# Patient Record
Sex: Female | Born: 1970
Health system: Southern US, Community
[De-identification: ages and names within clinical notes are randomized; demographics above are authoritative.]

## PROBLEM LIST (undated history)

## (undated) DIAGNOSIS — E119 Type 2 diabetes mellitus without complications: Secondary | ICD-10-CM

## (undated) HISTORY — PX: TONSILLECTOMY: SUR1361

## (undated) HISTORY — PX: ORTHOPEDIC SURGERY: SHX850

---

## 2008-12-03 ENCOUNTER — Ambulatory Visit: Payer: Self-pay | Admitting: Diagnostic Radiology

## 2008-12-03 ENCOUNTER — Emergency Department (HOSPITAL_BASED_OUTPATIENT_CLINIC_OR_DEPARTMENT_OTHER): Admission: EM | Admit: 2008-12-03 | Discharge: 2008-12-03 | Payer: Self-pay | Admitting: Emergency Medicine

## 2011-05-08 ENCOUNTER — Ambulatory Visit (INDEPENDENT_AMBULATORY_CARE_PROVIDER_SITE_OTHER): Payer: BC Managed Care – PPO

## 2011-05-08 DIAGNOSIS — M545 Low back pain, unspecified: Secondary | ICD-10-CM

## 2011-05-08 DIAGNOSIS — M25569 Pain in unspecified knee: Secondary | ICD-10-CM

## 2011-05-08 DIAGNOSIS — S73119A Iliofemoral ligament sprain of unspecified hip, initial encounter: Secondary | ICD-10-CM

## 2011-05-08 DIAGNOSIS — M25559 Pain in unspecified hip: Secondary | ICD-10-CM

## 2011-05-08 DIAGNOSIS — S63509A Unspecified sprain of unspecified wrist, initial encounter: Secondary | ICD-10-CM

## 2011-05-08 DIAGNOSIS — IMO0002 Reserved for concepts with insufficient information to code with codable children: Secondary | ICD-10-CM

## 2012-02-22 ENCOUNTER — Emergency Department (INDEPENDENT_AMBULATORY_CARE_PROVIDER_SITE_OTHER)
Admission: EM | Admit: 2012-02-22 | Discharge: 2012-02-22 | Disposition: A | Discharge status: Home / Self Care | Payer: BC Managed Care – PPO | Source: Home / Self Care

## 2012-02-22 ENCOUNTER — Encounter (HOSPITAL_COMMUNITY): Payer: Self-pay | Admitting: *Deleted

## 2012-02-22 DIAGNOSIS — M7711 Lateral epicondylitis, right elbow: Secondary | ICD-10-CM

## 2012-02-22 HISTORY — DX: Type 2 diabetes mellitus without complications: E11.9

## 2012-02-22 MED ORDER — HYDROCODONE-ACETAMINOPHEN 10-500 MG PO TABS: 1.0000 | ORAL_TABLET | Freq: Four times a day (QID) | ORAL | Status: DC | PRN

## 2012-02-22 NOTE — ED Notes (Signed)
Pt  Reports  r  Elbow  Pain  With  The  Pain  Radiating  Upwards  Toward  Her  Shoulder  She  denys  Any  specefic  Injury       She  Reports  The pain for  A  Month  And  Getting  Worse  Over last week

## 2012-02-22 NOTE — ED Provider Notes (Signed)
History     CSN: [id number]  Arrival date & time 02/22/12  1228   First MD Initiated Contact with Patient 02/22/12 1235      Chief Complaint  Patient presents with  . Elbow Pain   HPI 41 yr old hispanic female presentes to [facility] for elbow pain.  She states she has pain from her on the outer side of the elbow and it was painful to touch.  openeing a door and lifting things hurt is.  THis has been going on for the past 3 weeks to 1 month ago.  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 States that she was moving some furniture couple of weeks ago which was started this pain. States that pain associated but she cannot really move it and at night when she sleeps she has pain that debilitated and nicks it difficult for her to sleep. She has history of diabetes and is on insulin for this she has no primary care physician.  Past Medical History  Diagnosis Date  . Diabetes mellitus without complication     History reviewed. No pertinent past surgical history.  No family history on file.  History  Substance Use Topics  . Smoking status: Never Smoker   . Smokeless tobacco: Not on file  . Alcohol Use: No    OB History    Grav Para Term Preterm Abortions TAB SAB Ect Mult Living                  Review of Systems No headache no chest pain shortness of breath nausea vomiting no other issue  Allergies  Penicillins  Home Medications   Current Outpatient Rx  Name Route Sig Dispense Refill  . LANTUS [place] Subcutaneous Inject into the skin.    Marland Kitchen HUMALOG [place] Subcutaneous Inject into the skin.    Marland Kitchen METFORMIN HCL PO Oral Take by mouth.    Marland Kitchen RAMIPRIL PO Oral Take by mouth.      BP 142/86  Pulse 79  Temp 98.2 F (36.8 C) (Oral)  Resp 16  SpO2 100%  Physical Exam Morbid obesity, There is no height or weight on file to calculate BMI. Her into very pleasant no  distress Chest: Clear not sound S1-S2 no murmur rub or gallop Point tenderness over the right epicondyle laterally of the arm. Extensive flexion of a closed fist against resistance reproduces her pain  ED Course  ORTHOPEDIC INJURY TREATMENT Date/Time: 02/22/2012 1:46 PM Performed by: Rhetta Mura Authorized by: Rhetta Mura Consent: Verbal consent obtained. Consent given by: patient Patient understanding: patient states understanding of the procedure being performed Patient consent: the patient's understanding of the procedure matches consent given Procedure consent: procedure consent matches procedure scheduled Relevant documents: relevant documents present and verified Test results: test results available and properly labeled Site marked: the operative site was marked Imaging studies: imaging studies available Patient identity confirmed: verbally with patient Injury location: forearm Pre-procedure distal perfusion: normal Pre-procedure neurological function: normal Pre-procedure range of motion: normal Local anesthesia used: yes Anesthesia: local infiltration Local anesthetic: lidocaine 2% with epinephrine Patient sedated: no Post-procedure distal perfusion: normal Post-procedure neurological function: normal Post-procedure range of motion: normal Comments: Patient was injected with Depo-Medrol 40 and lidocaine 2% in the lateral epicondyle of the right arm by Dr. Leslee Home under informed consent. The patient experienced some pain subsequent  to this injection however felt better on discharge.   (including critical care time)  Labs Reviewed - No data to display No results found.   No diagnosis found.    MDM  Patient was injected with lidocaine and steroids as per above-she was instructed in post care instructions inclusive of rest 3 days, icing the area. Further management was recommended in terms of her using exercises for lateral epicondylitis and  these were given to her. Patient was instructed to get a primary care physician and I left her with a couple of names in the room.   [place], [facility] 1:47 PM         Rhetta Mura, MD 02/22/12 1348

## 2012-05-10 ENCOUNTER — Ambulatory Visit: Payer: BC Managed Care – PPO

## 2012-05-10 ENCOUNTER — Ambulatory Visit (INDEPENDENT_AMBULATORY_CARE_PROVIDER_SITE_OTHER): Payer: BC Managed Care – PPO | Admitting: Family Medicine

## 2012-05-10 DIAGNOSIS — S161XXA Strain of muscle, fascia and tendon at neck level, initial encounter: Secondary | ICD-10-CM

## 2012-05-10 DIAGNOSIS — S39012A Strain of muscle, fascia and tendon of lower back, initial encounter: Secondary | ICD-10-CM

## 2012-05-10 DIAGNOSIS — IMO0002 Reserved for concepts with insufficient information to code with codable children: Secondary | ICD-10-CM

## 2012-05-10 DIAGNOSIS — M542 Cervicalgia: Secondary | ICD-10-CM

## 2012-05-10 MED ORDER — IBUPROFEN 800 MG PO TABS: 800.0000 mg | ORAL_TABLET | Freq: Three times a day (TID) | ORAL | Status: AC | PRN

## 2012-05-10 MED ORDER — CYCLOBENZAPRINE HCL 10 MG PO TABS: 10.0000 mg | ORAL_TABLET | Freq: Two times a day (BID) | ORAL | Status: AC | PRN

## 2012-05-10 NOTE — Patient Instructions (Addendum)
Let me know if your neck/ back are not better in the next few days- Sooner if worse.
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
## 2013-04-25 ENCOUNTER — Emergency Department (INDEPENDENT_AMBULATORY_CARE_PROVIDER_SITE_OTHER): Payer: BC Managed Care – PPO

## 2013-04-25 ENCOUNTER — Encounter (HOSPITAL_COMMUNITY): Payer: Self-pay | Admitting: Emergency Medicine

## 2013-04-25 ENCOUNTER — Emergency Department (HOSPITAL_COMMUNITY)
Admission: EM | Admit: 2013-04-25 | Discharge: 2013-04-25 | Disposition: A | Discharge status: Home / Self Care | Payer: BC Managed Care – PPO | Source: Home / Self Care | Attending: Emergency Medicine | Admitting: Emergency Medicine

## 2013-04-25 DIAGNOSIS — J019 Acute sinusitis, unspecified: Secondary | ICD-10-CM

## 2013-04-25 DIAGNOSIS — J209 Acute bronchitis, unspecified: Secondary | ICD-10-CM

## 2013-04-25 MED ORDER — LEVOFLOXACIN 750 MG PO TABS: 750.0000 mg | ORAL_TABLET | Freq: Every day | ORAL | Status: AC

## 2013-04-25 MED ORDER — HYDROCOD POLST-CHLORPHEN POLST 10-8 MG/5ML PO LQCR: 5.0000 mL | Freq: Two times a day (BID) | ORAL | Status: AC | PRN

## 2013-04-25 NOTE — ED Provider Notes (Signed)
Chief Complaint:   Chief Complaint  Patient presents with  . URI    History of Present Illness:   Tiffany Velazquez is a 42 year old female with diabetes who has had a 10-14 day history of sore throat, fever up to 101, chills, sweats, aches, fatigue, weakness, nonproductive cough, chest tightness, wheezing, chest pain, nasal congestion with clear drainage, headache, sinus pressure, ear congestion, nausea, and vomiting. She went to [facility] when this began. A chest x-ray was obtained but she does not know the results. She was given a Z-Pak and an inhaler but does not feel any better.  Review of Systems:  Other than noted above, the patient denies any of the following symptoms: Systemic:  No fevers, chills, sweats, weight loss or gain, fatigue, or tiredness. Eye:  No redness or discharge. ENT:  No ear pain, drainage, headache, nasal congestion, drainage, sinus pressure, difficulty swallowing, or sore throat. Neck:  No neck pain or swollen glands. Lungs:  No cough, sputum production, hemoptysis, wheezing, chest tightness, shortness of breath or chest pain. GI:  No abdominal pain, nausea, vomiting or diarrhea.  PMFSH:  Past medical history, family history, social history, meds, and allergies were reviewed. She is allergic to penicillin. She has diabetes and takes Lantus insulin, Humalog insulin, metformin, and Altace.  Physical Exam:   Vital signs:  LMP 04/23/2013 General:  Alert and oriented.  In no distress.  Skin warm and dry. Eye:  No conjunctival injection or drainage. Lids were normal. ENT:  TMs and canals were normal, without erythema or inflammation.  Nasal mucosa was clear and uncongested, without drainage.  Mucous membranes were moist.  Pharynx was clear with no exudate or drainage.  There were no oral ulcerations or lesions. Neck:  Supple, no adenopathy, tenderness or mass. Lungs:  No respiratory distress.  Lungs were clear to auscultation, without wheezes, rales or  rhonchi.  Breath sounds were clear and equal bilaterally.  Heart:  Regular rhythm, without gallops, murmers or rubs. Skin:  Clear, warm, and dry, without rash or lesions.  Radiology:  Dg Chest 2 View  04/25/2013   CLINICAL DATA:  Productive cough and chest congestion  EXAM: CHEST  2 VIEW  COMPARISON:  None.  FINDINGS: Lungs are clear. Heart size and pulmonary vascularity are normal. No adenopathy. No pneumothorax. No bone lesions. There is slight upper thoracic levoscoliosis.  IMPRESSION: No abnormality noted.   Electronically Signed   By: Bretta Bang M.D.   On: 04/25/2013 09:45   Assessment:  The primary encounter diagnosis was Acute bronchitis. A diagnosis of Acute sinusitis was also pertinent to this visit.  Plan:   1.  Meds:  The following meds were prescribed:   Discharge Medication List as of 04/25/2013  9:55 AM    START taking these medications   Details  chlorpheniramine-HYDROcodone (TUSSIONEX) 10-8 MG/5ML LQCR Take 5 mLs by mouth every 12 (twelve) hours as needed for cough., Starting 04/25/2013, Until Discontinued, Normal    levofloxacin (LEVAQUIN) 750 MG tablet Take 1 tablet (750 mg total) by mouth daily., Starting 04/25/2013, Until Discontinued, Normal        2.  Patient Education/Counseling:  The patient was given appropriate handouts, self care instructions, and instructed in symptomatic relief. Should continue to use her albuterol inhaler.  3.  Follow up:  The patient was told to follow up if no better in 3 to 4 days, if becoming worse in any way, and given some red flag symptoms such as increasing fever or worsening  difficulty breathing which would prompt immediate return.  Follow up here if necessary.      Reuben Likes, MD 04/25/13 1051

## 2013-04-25 NOTE — ED Notes (Signed)
C/o  Productive cough with sob.  Chest soreness.  Body aches.  Sore throat.  And chills.  Pt states that dizziness started today.   Pt has used z pak and over counter meds with no relief.  Pt states feels like I'm getting worse.

## 2016-02-28 ENCOUNTER — Ambulatory Visit (INDEPENDENT_AMBULATORY_CARE_PROVIDER_SITE_OTHER): Payer: BC Managed Care – PPO

## 2016-02-28 ENCOUNTER — Ambulatory Visit (INDEPENDENT_AMBULATORY_CARE_PROVIDER_SITE_OTHER): Payer: BC Managed Care – PPO | Admitting: Podiatry

## 2016-02-28 ENCOUNTER — Encounter: Payer: Self-pay | Admitting: Podiatry

## 2016-02-28 VITALS — Ht 63.0 in | Wt 200.0 lb

## 2016-02-28 DIAGNOSIS — M79671 Pain in right foot: Secondary | ICD-10-CM

## 2016-02-28 DIAGNOSIS — M722 Plantar fascial fibromatosis: Secondary | ICD-10-CM

## 2016-02-28 MED ORDER — NONFORMULARY OR COMPOUNDED ITEM: 1.0000 g | Freq: Four times a day (QID) | 2 refills | Status: AC

## 2016-02-28 NOTE — Progress Notes (Signed)
   Subjective:    Patient ID: Tiffany DeisHeidy Velazquez, female    DOB: 02/10/1971, 45 y.o.   MRN: [id number]  HPI    Review of Systems  Gastrointestinal: Positive for diarrhea.  Musculoskeletal: Positive for back pain.       Objective:   Physical Exam        Assessment & Plan:

## 2016-03-01 ENCOUNTER — Ambulatory Visit: Payer: Self-pay | Admitting: Podiatry

## 2016-03-05 MED ORDER — BETAMETHASONE SOD PHOS & ACET 6 (3-3) MG/ML IJ SUSP: 3.0000 mg | Freq: Once | INTRAMUSCULAR | Status: AC

## 2016-03-05 NOTE — Progress Notes (Signed)
Patient ID: Tiffany DeisHeidy Velazquez, female   DOB: Nov 25, 1970, 45 y.o.   MRN: [id number] Subjective: Patient presents today for pain and tenderness in the right foot. Patient states the foot pain has been hurting for several weeks now. Patient states that it hurts in the mornings with the first steps out of bed. Patient presents today for further treatment and evaluation  Objective: Physical Exam General: The patient is alert and oriented x3 in no acute distress.  Dermatology: Skin is warm, dry and supple bilateral lower extremities. Negative for open lesions or macerations bilateral.   Vascular: Dorsalis Pedis and Posterior Tibial pulses palpable bilateral.  Capillary fill time is immediate to all digits.  Neurological: Epicritic and protective threshold intact bilateral.   Musculoskeletal: Tenderness to palpation at the medial calcaneal tubercale and through the insertion of the plantar fascia of the right foot. All other joints range of motion within normal limits bilateral. Strength 5/5 in all groups bilateral.   Radiographic exam: Normal osseous mineralization. Joint spaces preserved. No fracture/dislocation/boney destruction. Calcaneal spur present with mild thickening of plantar fascia right. No other soft tissue abnormalities or radiopaque foreign bodies.   Assessment: 1. Plantar fasciitis right 2. Pain in right foot  Plan of Care:  1. Patient evaluated. Xrays reviewed.   2. Injection of 0.5cc Cel[place] soluspan injected into the right heel at the insertion of the plantar fascia.  3. Instructed patient regarding therapies and modalities at home to alleviate symptoms.  4.Rx for anti-inflammatory pain cream dispensed through [facility]. 5. Plantar fascial band(s) dispensed. 6. Return to clinic in 4 weeks.

## 2016-03-29 ENCOUNTER — Ambulatory Visit (INDEPENDENT_AMBULATORY_CARE_PROVIDER_SITE_OTHER): Payer: BC Managed Care – PPO | Admitting: Podiatry

## 2016-03-29 DIAGNOSIS — M79671 Pain in right foot: Secondary | ICD-10-CM

## 2016-03-29 DIAGNOSIS — M722 Plantar fascial fibromatosis: Secondary | ICD-10-CM

## 2016-03-29 DIAGNOSIS — M7731 Calcaneal spur, right foot: Secondary | ICD-10-CM | POA: Diagnosis not present

## 2016-03-29 NOTE — Patient Instructions (Signed)
Pre-Operative Instructions  Congratulations, you have decided to take an important step to improving your quality of life.  You can be assured that the doctors of [facility] will be with you every step of the way.  1. Plan to be at the surgery center/hospital at least 1 (one) hour prior to your scheduled time unless otherwise directed by the surgical center/hospital staff.  You must have a responsible adult accompany you, remain during the surgery and drive you home.  Make sure you have directions to the surgical center/hospital and know how to get there on time. 2. For hospital based surgery you will need to obtain a history and physical form from your family physician within 1 month prior to the date of surgery- we will give you a form for you primary physician.  3. We make every effort to accommodate the date you request for surgery.  There are however, times where surgery dates or times have to be moved.  We will contact you as soon as possible if a change in schedule is required.   4. No Aspirin/Ibuprofen for one week before surgery.  If you are on aspirin, any non-steroidal anti-inflammatory medications (Mobic, Aleve, Ibuprofen) you should stop taking it 7 days prior to your surgery.  You make take Tylenol  For pain prior to surgery.  5. Medications- If you are taking daily heart and blood pressure medications, seizure, reflux, allergy, asthma, anxiety, pain or diabetes medications, make sure the surgery center/hospital is aware before the day of surgery so they may notify you which medications to take or avoid the day of surgery. 6. No food or drink after midnight the night before surgery unless directed otherwise by surgical center/hospital staff. 7. No alcoholic beverages 24 hours prior to surgery.  No smoking 24 hours prior to or 24 hours after surgery. 8. Wear loose pants or shorts- loose enough to fit over bandages, boots, and casts. 9. No slip on shoes, sneakers are best. 10. Bring  your boot with you to the surgery center/hospital.  Also bring crutches or a walker if your physician has prescribed it for you.  If you do not have this equipment, it will be provided for you after surgery. 11. If you have not been contracted by the surgery center/hospital by the day before your surgery, call to confirm the date and time of your surgery. 12. Leave-time from work may vary depending on the type of surgery you have.  Appropriate arrangements should be made prior to surgery with your employer. 13. Prescriptions will be provided immediately following surgery by your doctor.  Have these filled as soon as possible after surgery and take the medication as directed. 14. Remove nail polish on the operative foot. 15. Wash the night before surgery.  The night before surgery wash the foot and leg well with the antibacterial soap provided and water paying special attention to beneath the toenails and in between the toes.  Rinse thoroughly with water and dry well with a towel.  Perform this wash unless told not to do so by your physician.  Enclosed: 1 Ice pack (please put in freezer the night before surgery)   1 Hibiclens skin cleaner   Pre-op Instructions  If you have any questions regarding the instructions, do not hesitate to call our office.  [place]: [address] [place], [place] [address] [phone]  [place]: [address] [place], [place] [address] [phone]  [place]: [address]  [place], [place] [address] [phone]   Dr.   Norman Regal DPM, Dr. Matthew Wagoner DPM, Dr. M. Todd Hyatt DPM, Dr. Titorya Stover DPM 

## 2016-03-30 ENCOUNTER — Telehealth: Payer: Self-pay | Admitting: *Deleted

## 2016-03-30 NOTE — Telephone Encounter (Signed)
"  I was there yesterday.  I decided on a date for my surgery.  I would like to do it on January 4."  I will get it scheduled.  Someone from the surgical center will call you a day or two prior to with the arrival time.  You can go ahead and register with the surgical center.  Instructions are in your brochure that was given to you.  "I don't have a computer at home and I don't have the brochure with me.  Can you give me the instructions to register?"  I gave patient the requested information.

## 2016-04-02 NOTE — Progress Notes (Signed)
Patient ID: Tiffany DeisHeidy Heiss, female   DOB: [birth year], 45 y.o.   MRN: [id number] Subjective: Patient presents today for follow-up evaluation of plantar fasciitis to the right foot. Patient states that she continues to have ongoing pain despite all conservative treatment. Patient states that her pain is on the bottom of the heel. Patient states that the plantar fascial bands are not helping. The anti-inflammatory pain cream dispensed through [facility] is not helping. She also states that the injections did not help.  Patient presents today for further treatment and evaluation  Objective: Physical Exam General: The patient is alert and oriented x3 in no acute distress.  Dermatology: Skin is warm, dry and supple bilateral lower extremities. Negative for open lesions or macerations bilateral.   Vascular: Dorsalis Pedis and Posterior Tibial pulses palpable bilateral.  Capillary fill time is immediate to all digits.  Neurological: Epicritic and protective threshold intact bilateral.   Musculoskeletal: Tenderness to palpation at the medial calcaneal tubercale and through the insertion of the plantar fascia of the right foot. All other joints range of motion within normal limits bilateral. Strength 5/5 in all groups bilateral.   Radiographic exam: There is a large prominent irregular plantar calcaneal heel spur noted to the right lower extremity. Abnormal spur formation.  Assessment: 1. Plantar fasciitis right 2. Pain in right foot 3. Retrocalcaneal heel spur right lower extremity  Plan of Care:  1. Patient evaluated. Xrays reviewed.   2. Today we discussed in detail the conservative versus surgical management of plantar fasciitis. All conservative modalities have failed to provide any sore satisfactory alleviation of symptoms the patient. The patient opted for surgical correction. All risks and possible complications and details of the procedure were explained. 3. Surgery would consist of  plantar fasciotomy possibly open with resection of plantar calcaneal heel spur. 4. Authorization for surgery was initiated today. 5. Patient was scanned for custom molded orthotics 6. Postoperative shoe dispensed 7. Return to clinic in 1 week postop

## 2016-04-03 ENCOUNTER — Telehealth: Payer: Self-pay | Admitting: *Deleted

## 2016-04-03 NOTE — Telephone Encounter (Signed)
"  I have a form that I faxed to you guys.  Let me know if you got them.  If you want me to scan them to you let me know."

## 2016-05-02 DIAGNOSIS — M7731 Calcaneal spur, right foot: Secondary | ICD-10-CM | POA: Diagnosis not present

## 2016-05-02 DIAGNOSIS — M722 Plantar fascial fibromatosis: Secondary | ICD-10-CM | POA: Diagnosis not present

## 2016-05-04 ENCOUNTER — Encounter: Payer: Self-pay | Admitting: Podiatry

## 2016-05-04 NOTE — Progress Notes (Signed)
Patient presents this AM for surgical excision of a plantar heel spur to the right lower extremity with plantar fasciectomy.   Patient also complains this morning of pain relating to the fifth digit of the right foot.  Patient states the pain has been ongoing for several months now.  Patient has an overlying callus lesion also noted to the fifth digit right foot.  This morning that the decision was made preoperatively to go ahead with an exostectomy to the fifth digit right foot.  All possible complications and ils of the surgery were explained in detail.  All patient questions were answered.  No guarantees were expressed or implied.  Procedure was added to the consent and both surgeon andpatient initials were placed.  Assessment: Symptomatic exostosis/spur fifth digit right foot  Plan: Exostectomy fifth digit right foot   Felecia ShellingBrent M. Ninamarie Keel, DPM [facility]  Dr. Felecia ShellingBrent M. Tyray Proch, DPM    [address]                                        [place], [place] [address]                Office [phone]  Fax [phone]

## 2016-05-08 ENCOUNTER — Telehealth: Payer: Self-pay

## 2016-05-08 NOTE — Telephone Encounter (Signed)
LVM regarding pt post op status, she is to call with questions or concerns

## 2016-05-10 ENCOUNTER — Ambulatory Visit (INDEPENDENT_AMBULATORY_CARE_PROVIDER_SITE_OTHER): Payer: BC Managed Care – PPO

## 2016-05-10 ENCOUNTER — Ambulatory Visit (INDEPENDENT_AMBULATORY_CARE_PROVIDER_SITE_OTHER): Payer: Self-pay | Admitting: Podiatry

## 2016-05-10 DIAGNOSIS — M7731 Calcaneal spur, right foot: Secondary | ICD-10-CM | POA: Diagnosis not present

## 2016-05-10 DIAGNOSIS — Z9889 Other specified postprocedural states: Secondary | ICD-10-CM

## 2016-05-10 MED ORDER — TRAMADOL HCL 50 MG PO TABS: 50.0000 mg | ORAL_TABLET | Freq: Three times a day (TID) | ORAL | 0 refills | Status: AC | PRN

## 2016-05-11 DIAGNOSIS — R52 Pain, unspecified: Secondary | ICD-10-CM

## 2016-05-14 NOTE — Progress Notes (Signed)
Subjective: Patient presents today status post one week open plantar fasciotomy with heel spur resection to the right foot. Date of surgery 10/02/2016. Patient states she has a moderate amount of pain. However she states she's doing well.  Objective: Skin incision is well coapted. Sutures are intact. No sign of infectious process.  Assessment: Status post open plantar fasciotomy with heel spur resection right foot. Date of surgery Dorene SorrowJerry fourth 2018.  Plan of care: Today patient was evaluated. Dressings were changed. Return to clinic in 1 week

## 2016-05-17 ENCOUNTER — Ambulatory Visit: Payer: BC Managed Care – PPO

## 2016-05-22 ENCOUNTER — Ambulatory Visit (INDEPENDENT_AMBULATORY_CARE_PROVIDER_SITE_OTHER): Payer: Self-pay | Admitting: Podiatry

## 2016-05-22 DIAGNOSIS — Z9889 Other specified postprocedural states: Secondary | ICD-10-CM

## 2016-05-24 ENCOUNTER — Encounter: Payer: Self-pay | Admitting: Podiatry

## 2016-05-28 NOTE — Progress Notes (Signed)
Subjective: Patient presents today status post one week open plantar fasciotomy with heel spur resection to the right foot. Date of surgery 05/04/2016. Patient states she has a moderate amount of pain. However she states she's doing well.  Objective: Skin incision is well coapted. Sutures are intact. No sign of infectious process.  Assessment: Status post open plantar fasciotomy with heel spur resection right foot. Date of surgery 05/04/2016  Plan of care: Today patient was evaluated. Sutures were removed. Patient begins getting the foot wet in 2 days.  Return to clinic in 2 weeks for possible ingrown toenail left great toe lateral border

## 2016-05-30 ENCOUNTER — Encounter: Payer: Self-pay | Admitting: Podiatry

## 2016-06-05 ENCOUNTER — Ambulatory Visit (INDEPENDENT_AMBULATORY_CARE_PROVIDER_SITE_OTHER): Payer: BC Managed Care – PPO | Admitting: Podiatry

## 2016-06-05 ENCOUNTER — Other Ambulatory Visit: Payer: Self-pay | Admitting: *Deleted

## 2016-06-05 ENCOUNTER — Encounter: Payer: Self-pay | Admitting: Urology

## 2016-06-05 DIAGNOSIS — L03039 Cellulitis of unspecified toe: Secondary | ICD-10-CM

## 2016-06-05 DIAGNOSIS — M79676 Pain in unspecified toe(s): Secondary | ICD-10-CM

## 2016-06-05 DIAGNOSIS — L600 Ingrowing nail: Secondary | ICD-10-CM

## 2016-06-05 DIAGNOSIS — Z9889 Other specified postprocedural states: Secondary | ICD-10-CM

## 2016-06-05 MED ORDER — GENTAMICIN SULFATE 0.1 % EX CREA: 1.0000 "application " | TOPICAL_CREAM | Freq: Three times a day (TID) | CUTANEOUS | 0 refills | Status: AC

## 2016-06-05 MED ORDER — DOXYCYCLINE HYCLATE 100 MG PO TABS: 100.0000 mg | ORAL_TABLET | Freq: Two times a day (BID) | ORAL | 0 refills | Status: DC

## 2016-06-05 MED ORDER — GENTAMICIN SULFATE 0.1 % EX CREA: 1.0000 "application " | TOPICAL_CREAM | Freq: Three times a day (TID) | CUTANEOUS | 0 refills | Status: DC

## 2016-06-05 NOTE — Patient Instructions (Signed)

## 2016-06-06 NOTE — Progress Notes (Signed)
DOS 01.04.2018 Endoscopic plantar fasciotomy (EPF) right. Possible open plantar fasciotomy with heel spur resection right.

## 2016-06-08 ENCOUNTER — Encounter: Payer: Self-pay | Admitting: Podiatry

## 2016-06-11 NOTE — Progress Notes (Signed)
   Subjective: Patient presents today for evaluation of pain in toe(s). Patient is concerned for possible ingrown nail. Patient states that the pain has been present for a few weeks now. Patient presents today for further treatment and evaluation. Patient also presents status post open plantar fasciotomy with heel spur resection to the right foot. There is surgery 05/04/2016. Patient still states that she has some moderate pain. However she is doing well.  Objective:  General: Well developed, nourished, in no acute distress, alert and oriented x3   Dermatology: Skin is warm, dry and supple bilateral. Lateral border of the left great toe appears to be erythematous with evidence of an ingrowing nail. Pain on palpation noted to the border of the nail fold. The remaining nails appear unremarkable at this time. There are no open sores, lesions.  Vascular: Dorsalis Pedis artery and Posterior Tibial artery pedal pulses palpable. No lower extremity edema noted.   Neruologic: Grossly intact via light touch bilateral.  Musculoskeletal: Muscular strength within normal limits in all groups bilateral. Normal range of motion noted to all pedal and ankle joints.   Assesement: #1 Paronychia with ingrowing nail lateral border left great toe #2 Pain in toe #3 Incurvated nail #4 status post open plantar fasciotomy with heel spur resection right foot. Date of surgery 05/04/2016.  Plan of Care:  1. Patient evaluated.  2. Discussed treatment alternatives and plan of care. Explained nail avulsion procedure and post procedure course to patient. 3. Patient opted for permanent partial nail avulsion.  4. Prior to procedure, local anesthesia infiltration utilized using 3 ml of a 50:50 mixture of 2% plain lidocaine and 0.5% plain marcaine in a normal hallux block fashion and a betadine prep performed.  5. Partial permanent nail avulsion with chemical matrixectomy performed using 3x30sec applications of phenol followed  by alcohol flush.  6. Light dressing applied. 7. Prescription for gentamicin cream 8. Prescription for doxycycline 100 mg 9. Return to clinic in 2 weeks.   Felecia ShellingBrent M. Bronsyn Shappell, DPM [facility]  Dr. Felecia ShellingBrent M. Jlyn Cerros, DPM    [address]                                        [place], [place] [address]                Office [phone]  Fax [phone]

## 2016-06-19 ENCOUNTER — Ambulatory Visit: Payer: BC Managed Care – PPO | Admitting: Podiatry

## 2016-06-21 ENCOUNTER — Telehealth: Payer: Self-pay | Admitting: *Deleted

## 2016-06-21 ENCOUNTER — Ambulatory Visit (INDEPENDENT_AMBULATORY_CARE_PROVIDER_SITE_OTHER): Payer: Self-pay | Admitting: Podiatry

## 2016-06-21 DIAGNOSIS — Z9889 Other specified postprocedural states: Secondary | ICD-10-CM

## 2016-06-21 MED ORDER — ACETAMINOPHEN-CODEINE #3 300-30 MG PO TABS: 1.0000 | ORAL_TABLET | Freq: Four times a day (QID) | ORAL | 0 refills | Status: AC | PRN

## 2016-06-21 MED ORDER — DOXYCYCLINE HYCLATE 100 MG PO TABS: 100.0000 mg | ORAL_TABLET | Freq: Two times a day (BID) | ORAL | 0 refills | Status: AC

## 2016-06-21 NOTE — Telephone Encounter (Addendum)
Pt states she is having more redness and swelling of the foot that had surgery 05/04/2016, and is back at work and having to take lots of breaks. Pt states she would like a strong pain medication that won't make her droozy, and can't take tramadol because it makes her nauseous and didn't;t help the pain. Left message informing pt that her being at work and walking was causing increased swelling and redness in the foot, to go back to resting and  Icing for comfort and swelling like when she 1st had her surgery. I told pt I would inform Dr. Logan BoresEvans and call with more information. 07/24/2016-DrLogan Bores. Evans ordered Peripheral Neuropathy cream from [facility]. Faxed orders to [facility].

## 2016-06-22 NOTE — Telephone Encounter (Signed)
Patient was seen at appointment on 06/21/16 at 4:15

## 2016-06-23 NOTE — Progress Notes (Signed)
   Subjective: Patient presents today for follow-up evaluation of the permanent partial nail avulsion to the lateral border of the left great toe. Patient states that it is completely resolved she's feeling much better. Patient is more concerned regarding her open plantar fasciotomy with heel spur resection to the right foot. Patient recently began going back to work and did an extensive amount of walking. She states that is very painful and swollen and sore. Patient also presents status post open plantar fasciotomy with heel spur resection to the right foot. There is surgery 05/04/2016. Patient still states that she has some moderate pain. However she is doing well.  Objective:  General: Well developed, nourished, in no acute distress, alert and oriented x3   Dermatology: Skin is warm, dry and supple bilateral. Lateral border of the left great toe appears to be erythematous with evidence of an ingrowing nail. Pain on palpation noted to the border of the nail fold. The remaining nails appear unremarkable at this time. There are no open sores, lesions.  Vascular: Dorsalis Pedis artery and Posterior Tibial artery pedal pulses palpable. No lower extremity edema noted.   Neruologic: Grossly intact via light touch bilateral.  Musculoskeletal: Muscular strength within normal limits in all groups bilateral. Normal range of motion noted to all pedal and ankle joints.   Assesement: #1 status post partial permanent nail avulsion lateral border left great toe-resolved  #2 status post open plantar fasciotomy with heel spur resection right foot. Date of surgery 05/04/2016. #3 pain and edema right foot  Plan of Care:  1. Patient evaluated.  2. Compression anklet was dispensed today 3. Prescription for Augmentin for possible cellulitis 4. Prescription for Tylenol with Codeine No. 3 5. Recommend new shoes at [employer] 6. Return to clinic in 4 weeks  Felecia ShellingBrent M. Ossiel Marchio, DPM [facility]  Dr. Felecia ShellingBrent M. Dewey Neukam, DPM    [address]                                        [place], [place] [address]                Office [phone]) [phone]  Fax [phone]

## 2016-06-26 ENCOUNTER — Ambulatory Visit: Payer: BC Managed Care – PPO | Admitting: Podiatry

## 2016-07-04 ENCOUNTER — Other Ambulatory Visit: Payer: Self-pay | Admitting: Podiatry

## 2016-07-24 ENCOUNTER — Ambulatory Visit (INDEPENDENT_AMBULATORY_CARE_PROVIDER_SITE_OTHER): Payer: BC Managed Care – PPO | Admitting: Podiatry

## 2016-07-24 DIAGNOSIS — M79671 Pain in right foot: Secondary | ICD-10-CM

## 2016-07-24 DIAGNOSIS — G5791 Unspecified mononeuropathy of right lower limb: Secondary | ICD-10-CM

## 2016-07-24 DIAGNOSIS — Z9889 Other specified postprocedural states: Secondary | ICD-10-CM

## 2016-07-24 MED ORDER — GABAPENTIN 100 MG PO CAPS: 100.0000 mg | ORAL_CAPSULE | Freq: Three times a day (TID) | ORAL | 0 refills | Status: AC

## 2016-07-24 MED ORDER — NONFORMULARY OR COMPOUNDED ITEM: 2 refills | Status: AC

## 2016-07-25 MED ORDER — NONFORMULARY OR COMPOUNDED ITEM: 2 refills | Status: AC

## 2016-07-28 NOTE — Progress Notes (Signed)
   Subjective: Patient also presents status post open plantar fasciotomy with heel spur resection to the right foot. There is surgery 05/04/2016. Patient continues to have some numbness on the medial side of the heel with intermittent sharp pains.  Objective:  General: Well developed, nourished, in no acute distress, alert and oriented x3   Dermatology: Skin is warm, dry and supple bilateral. Lateral border of the left great toe appears to be erythematous with evidence of an ingrowing nail. Pain on palpation noted to the border of the nail fold. The remaining nails appear unremarkable at this time. There are no open sores, lesions.  Vascular: Dorsalis Pedis artery and Posterior Tibial artery pedal pulses palpable. No lower extremity edema noted.   Neruologic: Grossly intact via light touch bilateral.  Musculoskeletal: Muscular strength within normal limits in all groups bilateral. Normal range of motion noted to all pedal and ankle joints.   Assesement: #1 status post partial permanent nail avulsion lateral border left great toe-resolved  #2 status post open plantar fasciotomy with heel spur resection right foot. Date of surgery 05/04/2016. #3 pain and edema right foot possibly secondary to neuritis  Plan of Care:  1. Patient evaluated.  2. Continue compression anklet as needed 3. Prescription for neuropathic pain cream dispensed through [facility] 4. Prescription for gabapentin 5. Return to clinic in 4 weeks  Felecia Shelling, DPM [facility]  Dr. Felecia Shelling, DPM    [address]                                        [place], [place] [address]                Office [phone]  Fax [phone]

## 2016-08-14 ENCOUNTER — Ambulatory Visit: Payer: BC Managed Care – PPO | Admitting: Podiatry

## 2017-07-21 ENCOUNTER — Encounter (HOSPITAL_COMMUNITY): Payer: Self-pay | Admitting: Emergency Medicine

## 2017-07-21 ENCOUNTER — Other Ambulatory Visit: Payer: Self-pay

## 2017-07-21 DIAGNOSIS — Z794 Long term (current) use of insulin: Secondary | ICD-10-CM | POA: Diagnosis not present

## 2017-07-21 DIAGNOSIS — R319 Hematuria, unspecified: Secondary | ICD-10-CM | POA: Insufficient documentation

## 2017-07-21 DIAGNOSIS — E119 Type 2 diabetes mellitus without complications: Secondary | ICD-10-CM | POA: Diagnosis not present

## 2017-07-21 DIAGNOSIS — R1031 Right lower quadrant pain: Secondary | ICD-10-CM | POA: Diagnosis present

## 2017-07-21 DIAGNOSIS — R109 Unspecified abdominal pain: Secondary | ICD-10-CM

## 2017-07-21 DIAGNOSIS — Z5321 Procedure and treatment not carried out due to patient leaving prior to being seen by health care provider: Secondary | ICD-10-CM | POA: Insufficient documentation

## 2017-07-21 DIAGNOSIS — R300 Dysuria: Secondary | ICD-10-CM | POA: Insufficient documentation

## 2017-07-21 DIAGNOSIS — N12 Tubulo-interstitial nephritis, not specified as acute or chronic: Secondary | ICD-10-CM | POA: Diagnosis not present

## 2017-07-21 DIAGNOSIS — R112 Nausea with vomiting, unspecified: Secondary | ICD-10-CM | POA: Insufficient documentation

## 2017-07-21 DIAGNOSIS — Z79899 Other long term (current) drug therapy: Secondary | ICD-10-CM | POA: Diagnosis not present

## 2017-07-21 NOTE — ED Triage Notes (Signed)
Pt c/o 10/10 bilateral flank pain radiating to her abd with nausea and vomiting, pt states she has a painful urination and blood on the urine. Since last Wednesday. No fever, but is having chills.

## 2017-07-22 ENCOUNTER — Emergency Department (HOSPITAL_COMMUNITY)
Admission: EM | Admit: 2017-07-22 | Discharge: 2017-07-22 | Disposition: A | Discharge status: Home / Self Care | Payer: BC Managed Care – PPO | Source: Home / Self Care

## 2017-07-22 ENCOUNTER — Other Ambulatory Visit: Payer: Self-pay

## 2017-07-22 ENCOUNTER — Encounter (HOSPITAL_BASED_OUTPATIENT_CLINIC_OR_DEPARTMENT_OTHER): Payer: Self-pay | Admitting: *Deleted

## 2017-07-22 ENCOUNTER — Emergency Department (HOSPITAL_BASED_OUTPATIENT_CLINIC_OR_DEPARTMENT_OTHER): Payer: BC Managed Care – PPO

## 2017-07-22 ENCOUNTER — Emergency Department (HOSPITAL_BASED_OUTPATIENT_CLINIC_OR_DEPARTMENT_OTHER)
Admission: EM | Admit: 2017-07-22 | Discharge: 2017-07-22 | Disposition: A | Discharge status: Home / Self Care | Payer: BC Managed Care – PPO | Attending: Physician Assistant | Admitting: Physician Assistant

## 2017-07-22 DIAGNOSIS — Z794 Long term (current) use of insulin: Secondary | ICD-10-CM | POA: Insufficient documentation

## 2017-07-22 DIAGNOSIS — Z79899 Other long term (current) drug therapy: Secondary | ICD-10-CM | POA: Insufficient documentation

## 2017-07-22 DIAGNOSIS — N12 Tubulo-interstitial nephritis, not specified as acute or chronic: Secondary | ICD-10-CM | POA: Insufficient documentation

## 2017-07-22 DIAGNOSIS — E119 Type 2 diabetes mellitus without complications: Secondary | ICD-10-CM | POA: Insufficient documentation

## 2017-07-22 LAB — BASIC METABOLIC PANEL
Anion gap: 13 (ref 5–15)
BUN: 14 mg/dL (ref 6–20)
CO2: 22 mmol/L (ref 22–32)
CREATININE: 0.8 mg/dL (ref 0.44–1.00)
Calcium: 9.5 mg/dL (ref 8.9–10.3)
Chloride: 99 mmol/L — ABNORMAL LOW (ref 101–111)
GFR calc Af Amer: >60 mL/min (ref >60)
GLUCOSE: 329 mg/dL — AB (ref 65–99)
POTASSIUM: 4 mmol/L (ref 3.5–5.1)
Sodium: 134 mmol/L — ABNORMAL LOW (ref 135–145)

## 2017-07-22 LAB — URINALYSIS, ROUTINE W REFLEX MICROSCOPIC
Bacteria, UA: NONE SEEN
Bilirubin Urine: NEGATIVE
Glucose, UA: >=500 mg/dL — AB
KETONES UR: NEGATIVE mg/dL
Leukocytes, UA: NEGATIVE
Nitrite: NEGATIVE
Specific Gravity, Urine: 1.03 (ref 1.005–1.030)
pH: 5 (ref 5.0–8.0)

## 2017-07-22 LAB — CBC WITH DIFFERENTIAL/PLATELET
Basophils Absolute: 0 10*3/uL (ref 0.0–0.1)
Basophils Relative: 0 %
EOS ABS: 0.1 10*3/uL (ref 0.0–0.7)
EOS PCT: 1 %
HCT: 38.8 % (ref 36.0–46.0)
Hemoglobin: 13.2 g/dL (ref 12.0–15.0)
LYMPHS ABS: 2 10*3/uL (ref 0.7–4.0)
LYMPHS PCT: 19 %
MCH: 29.7 pg (ref 26.0–34.0)
MCHC: 34 g/dL (ref 30.0–36.0)
MCV: 87.4 fL (ref 78.0–100.0)
MONO ABS: 0.9 10*3/uL (ref 0.1–1.0)
MONOS PCT: 8 %
Neutro Abs: 7.2 10*3/uL (ref 1.7–7.7)
Neutrophils Relative %: 72 %
Platelets: 329 10*3/uL (ref 150–400)
RBC: 4.44 MIL/uL (ref 3.87–5.11)
RDW: 12.7 % (ref 11.5–15.5)
WBC: 10.2 10*3/uL (ref 4.0–10.5)

## 2017-07-22 LAB — I-STAT BETA HCG BLOOD, ED (MC, WL, AP ONLY): I-stat hCG, quantitative: <5 m[IU]/mL (ref <5)

## 2017-07-22 LAB — CBG MONITORING, ED: GLUCOSE-CAPILLARY: 262 mg/dL — AB (ref 65–99)

## 2017-07-22 MED ORDER — CEPHALEXIN 250 MG PO CAPS
500.0000 mg | ORAL_CAPSULE | Freq: Once | ORAL | Status: AC
Start: 2017-07-22 — End: 2017-07-22
  Administered 2017-07-22: 500 mg via ORAL
  Filled 2017-07-22: qty 2

## 2017-07-22 MED ORDER — CEPHALEXIN 500 MG PO CAPS: 500.0000 mg | ORAL_CAPSULE | Freq: Four times a day (QID) | ORAL | 0 refills | Status: AC

## 2017-07-22 MED ORDER — KETOROLAC TROMETHAMINE 30 MG/ML IJ SOLN
15.0000 mg | Freq: Once | INTRAMUSCULAR | Status: AC
  Administered 2017-07-22: 15 mg via INTRAVENOUS
  Filled 2017-07-22: qty 1

## 2017-07-22 MED ORDER — PROMETHAZINE HCL 25 MG/ML IJ SOLN
12.5000 mg | Freq: Once | INTRAMUSCULAR | Status: AC
  Administered 2017-07-22: 12.5 mg via INTRAVENOUS
  Filled 2017-07-22: qty 1

## 2017-07-22 MED ORDER — ONDANSETRON 4 MG PO TBDP
4.0000 mg | ORAL_TABLET | Freq: Once | ORAL | Status: AC | PRN
  Administered 2017-07-22: 4 mg via ORAL
  Filled 2017-07-22: qty 1

## 2017-07-22 MED ORDER — ONDANSETRON HCL 4 MG PO TABS: 4.0000 mg | ORAL_TABLET | Freq: Three times a day (TID) | ORAL | 0 refills | Status: AC | PRN

## 2017-07-22 MED ORDER — SODIUM CHLORIDE 0.9 % IV BOLUS (SEPSIS)
1000.0000 mL | Freq: Once | INTRAVENOUS | Status: AC
  Administered 2017-07-22: 1000 mL via INTRAVENOUS

## 2017-07-22 NOTE — ED Notes (Signed)
ED Provider at bedside. 

## 2017-07-22 NOTE — ED Notes (Signed)
Patient transported to CT 

## 2017-07-22 NOTE — ED Notes (Signed)
Pt given d/c instructions as per chart. Rx x 2. Verbalizes understanding. No questions. 

## 2017-07-22 NOTE — ED Notes (Signed)
Pt was being [facility], but left d/t wait. C/O right lower abd and flank pain. Hx chronic issues with N/V and abdominal discomfort. Also reports seeing blood in urine. Pt is also a diabetic and feels her sugar is up.

## 2017-07-22 NOTE — Discharge Instructions (Signed)
You were seen today and found to have pyelonephritis.  This is an infection of the bladder that is moving up in the kidneys.  Please take the antibiotic provided.  We will also give you nausea medicine.  If you are unable to stay hydrated and unable to keep your antibiotics down or not improving please return immediately to the emergency department.

## 2017-07-22 NOTE — ED Triage Notes (Addendum)
Pt states she was seen [facility] for right flank pain and due to the wait she left and came to our facility.states pain started one week ago.  C/o nausea and vomiting for one year. C/o burning and pain with urination. States she has had hematuria. Pt is a diabetic. Fevers unknown. C/o chills on and off. Denies history of kidney stone

## 2017-07-22 NOTE — ED Provider Notes (Signed)
[facility] EMERGENCY DEPARTMENT Provider Note   CSN: [id number] Arrival date & time: 07/22/17  0424     History   Chief Complaint Chief Complaint  Patient presents with  . Flank Pain    HPI Tiffany Velazquez is a 47 y.o. female.  HPI   Patient is a 47 year old female presenting with blood in her urine.  She reports she is had symptoms of dysuria, urinary frequency and urgency.  No fevers.  She reports she is had back pain as well.  Right back pain worse than left.  No history of stones.  Patient has chronic nausea vomiting has been seen "by multiple doctors and they cannot figure out what is going on".  Patient does report that her nausea and vomiting is gotten worse  Past Medical History:  Diagnosis Date  . Diabetes mellitus without complication (HCC)     There are no active problems to display for this patient.   Past Surgical History:  Procedure Laterality Date  . CESAREAN SECTION    . ORTHOPEDIC SURGERY    . TONSILLECTOMY       OB History   None      Home Medications    Prior to Admission medications   Medication Sig Start Date End Date Taking? Authorizing Provider  acetaminophen-codeine (TYLENOL #3) 300-30 MG tablet Take 1-2 tablets by mouth every 6 (six) hours as needed for moderate pain. 06/21/16   Felecia Shelling, DPM  atorvastatin (LIPITOR) 40 MG tablet Take 40 mg by mouth. 09/20/15   [provider]  cephALEXin (KEFLEX) 500 MG capsule Take 1 capsule (500 mg total) by mouth 4 (four) times daily for 7 days. 07/22/17 07/29/17  Hatsue Sime Lyn, MD  chlorpheniramine-HYDROcodone (TUSSIONEX) 10-8 MG/5ML LQCR Take 5 mLs by mouth every 12 (twelve) hours as needed for cough. Patient not taking: Reported on 02/28/2016 04/25/13   Reuben Likes, MD  cyclobenzaprine (FLEXERIL) 10 MG tablet Take 1 tablet (10 mg total) by mouth 2 (two) times daily as needed for muscle spasms. Patient not taking: Reported on 02/28/2016 05/10/12   Copland, Gwenlyn Found, MD   doxycycline (VIBRA-TABS) 100 MG tablet Take 1 tablet (100 mg total) by mouth 2 (two) times daily. 06/21/16   Felecia Shelling, DPM  gabapentin (NEURONTIN) 100 MG capsule Take 1 capsule (100 mg total) by mouth 3 (three) times daily. 07/24/16   Felecia Shelling, DPM  gentamicin cream (GARAMYCIN) 0.1 % Apply 1 application topically 3 (three) times daily. 06/05/16   Felecia Shelling, DPM  ibuprofen (ADVIL,MOTRIN) 800 MG tablet Take 1 tablet (800 mg total) by mouth every 8 (eight) hours as needed for pain. Patient not taking: Reported on 02/28/2016 05/10/12   Copland, Gwenlyn Found, MD  Insulin Glargine (LANTUS [place]) Inject into the skin.    [provider]  Insulin Lispro, Human, (HUMALOG [place]) Inject into the skin.    [provider]  JANUMET 50-1000 MG tablet Take 1 tablet by mouth 2 (two) times daily. 02/05/16   [provider]  levofloxacin (LEVAQUIN) 750 MG tablet Take 1 tablet (750 mg total) by mouth daily. Patient not taking: Reported on 02/28/2016 04/25/13   Reuben Likes, MD  meloxicam (MOBIC) 15 MG tablet TAKE 1 TABLET BY MOUTH EVERY DAY 07/04/16   Felecia Shelling, DPM  METFORMIN HCL PO Take by mouth.    [provider]  NONFORMULARY OR COMPOUNDED ITEM Apply 1-2 g topically 4 (four) times daily. 02/28/16   Gala Lewandowsky  M, DPM  NONFORMULARY OR COMPOUNDED ITEM [facility]  Peripheral Neuropathy Cream- Bupivacaine 1%, Doxepin 3%, Gabapentin 6%, Pentoxifylline 3%, Topiramate 1% Apply 1-2 grams to affected area 3-4 times daily Qty. 120 gm 3 refills 07/24/16   Felecia ShellingEvans, Brent M, DPM  NONFORMULARY OR COMPOUNDED ITEM [facility]: Peripheral Neuropathy cream - Bupivacaine 15, Doxepin 3%, Gabapentin 6%, Pentoxifylline 3%, Topiramate 1%, apply 1-2 grams to affected area 3-4 times daily. 07/25/16   Felecia ShellingEvans, Brent M, DPM  ondansetron (ZOFRAN) 4 MG tablet Take 1 tablet (4 mg total) by mouth every 8 (eight) hours as needed for nausea or vomiting. 07/22/17   Latia Mataya Lyn, MD    oxyCODONE-acetaminophen (PERCOCET/ROXICET) 5-325 MG tablet Take 1 tablet by mouth every 4 (four) hours as needed for severe pain. 05/04/16   Felecia ShellingEvans, Brent M, DPM  RAMIPRIL PO Take by mouth.    [provider]  traMADol (ULTRAM) 50 MG tablet Take 1 tablet (50 mg total) by mouth every 8 (eight) hours as needed. 05/10/16   Felecia ShellingEvans, Brent M, DPM  VICTOZA 18 MG/3ML SOPN INJECT 0.2 MLS (1.2 MG TOTAL) INTO THE SKIN DAILY AS DIRECTED 02/15/16   [provider]    Family History No family history on file.  Social History Social History   Tobacco Use  . Smoking status: Never Smoker  . Smokeless tobacco: Never Used  Substance Use Topics  . Alcohol use: No  . Drug use: No     Allergies   Penicillins and Pioglitazone   Review of Systems Review of Systems  Constitutional: Negative for activity change and fatigue.  HENT: Negative for congestion and drooling.   Eyes: Negative for discharge.  Respiratory: Negative for cough and chest tightness.   Cardiovascular: Negative for chest pain.  Gastrointestinal: Positive for nausea and vomiting. Negative for abdominal distention.  Genitourinary: Positive for dysuria, flank pain, frequency and hematuria. Negative for difficulty urinating.  Musculoskeletal: Negative for joint swelling.  Skin: Negative for rash.  Allergic/Immunologic: Negative for immunocompromised state.  Neurological: Negative for seizures and speech difficulty.  Psychiatric/Behavioral: Negative for agitation and behavioral problems.  All other systems reviewed and are negative.    Physical Exam Updated Vital Signs BP (!) 160/84   Pulse 83   Temp 98.8 F (37.1 C) (Oral)   Resp 20   SpO2 98%   Physical Exam  Constitutional: She is oriented to person, place, and time. She appears well-developed and well-nourished.  HENT:  Head: Normocephalic and atraumatic.  Eyes: Right eye exhibits no discharge. Left eye exhibits no discharge.  Cardiovascular: Normal rate  and regular rhythm.  Pulmonary/Chest: Effort normal and breath sounds normal. No respiratory distress.  Abdominal: Soft. She exhibits no distension. There is no tenderness.  Musculoskeletal:  No real CVA pain  Neurological: She is oriented to person, place, and time.  Skin: Skin is warm and dry. She is not diaphoretic.  Psychiatric: She has a normal mood and affect.  Nursing note and vitals reviewed.    ED Treatments / Results  Labs (all labs ordered are listed, but only abnormal results are displayed) Labs Reviewed  CBG MONITORING, ED - Abnormal; Notable for the following components:      Result Value   Glucose-Capillary 262 (*)    All other components within normal limits  URINE CULTURE    EKG None  Radiology Ct Renal Stone Study  Result Date: 07/22/2017 CLINICAL DATA:  Flank pain EXAM: CT ABDOMEN AND PELVIS WITHOUT CONTRAST TECHNIQUE: Multidetector CT imaging of the  abdomen and pelvis was performed following the standard protocol without IV contrast. COMPARISON:  None. FINDINGS: Lower chest: No basilar pulmonary nodules or pleural effusion. No apical pericardial effusion. Hepatobiliary: Normal hepatic contours and density. No intra- or extrahepatic biliary dilatation. Normal gallbladder. Pancreas: Normal parenchymal contours without ductal dilatation. No peripancreatic fluid collection. Spleen: Normal. Adrenals/Urinary Tract: --Adrenal glands: Normal. --Right kidney/ureter: Mild hydroureteronephrosis. No nephroureterolithiasis. No source of obstruction is identified. Mild perinephric stranding. --Left kidney/ureter: Mild left hydroureteronephrosis and mild perinephric stranding. No nephroureterolithiasis. --Urinary bladder: Normal appearance for the degree of distention. Stomach/Bowel: --Stomach/Duodenum: No hiatal hernia or other gastric abnormality. Normal duodenal course. --Small bowel: No dilatation or inflammation. --Colon: No focal abnormality. --Appendix: Normal.  Vascular/Lymphatic: Normal course and caliber of the major abdominal vessels. No abdominal or pelvic lymphadenopathy. Reproductive: Normal uterus and ovaries. T-shaped intrauterine contraceptive device. Musculoskeletal. No bony spinal canal stenosis or focal osseous abnormality. Other: None. IMPRESSION: Bilateral mild hydroureteronephrosis and perinephric stranding without nephroureterolithiasis or source of obstruction identified. This may indicate ascending urinary tract infection. Electronically Signed   By: Deatra Robinson M.D.   On: 07/22/2017 05:56    Procedures Procedures (including critical care time)  Medications Ordered in ED Medications  sodium chloride 0.9 % bolus 1,000 mL (0 mLs Intravenous Stopped 07/22/17 0529)  ketorolac (TORADOL) 30 MG/ML injection 15 mg (15 mg Intravenous Given 07/22/17 0502)  promethazine (PHENERGAN) injection 12.5 mg (12.5 mg Intravenous Given 07/22/17 0522)  cephALEXin (KEFLEX) capsule 500 mg (500 mg Oral Given 07/22/17 0521)     Initial Impression / Assessment and Plan / ED Course  I have reviewed the triage vital signs and the nursing notes.  Pertinent labs & imaging results that were available during my care of the patient were reviewed by me and considered in my medical decision making (see chart for details).    Patient is a 47 year old female presenting with blood in her urine.  She reports she is had symptoms of dysuria, urinary frequency and urgency.  No fevers.  She reports she is had back pain as well.  Right back pain worse than left.  No history of stones.  Patient has chronic nausea vomiting has been seen "by multiple doctors and they cannot figure out what is going on".  Patient does report that her nausea and vomiting is gotten worse  5:00 AM Patient had labs and urine done at [facility], the ER wait was too long so she came here.  I can see the results that shows evidence of creatinine being normal, no white count, urine having lots of red  blood cells.  Will get CT to rule out stone.  Send urine for culture.  6:11 AM CT shows developing of pyelonephritis.  Patient just told us that she is actually been started on Cipro yesterday by an urgent care.  We will switch her to Keflex given the high rates of Cipro resistance in the area.  Patient tolerating p.o.  We will give her nausea medication.  Return precautions expressed and  Final Clinical Impressions(s) / ED Diagnoses   Final diagnoses:  Pyelonephritis    ED Discharge Orders        Ordered    cephALEXin (KEFLEX) 500 MG capsule  4 times daily     07/22/17 0611    ondansetron (ZOFRAN) 4 MG tablet  Every 8 hours PRN     07/22/17 0611       Abelino Derrick, MD 07/22/17 [phone]

## 2017-07-23 ENCOUNTER — Emergency Department (HOSPITAL_BASED_OUTPATIENT_CLINIC_OR_DEPARTMENT_OTHER)
Admission: EM | Admit: 2017-07-23 | Discharge: 2017-07-23 | Disposition: A | Discharge status: Home / Self Care | Payer: BC Managed Care – PPO | Attending: Emergency Medicine | Admitting: Emergency Medicine

## 2017-07-23 ENCOUNTER — Other Ambulatory Visit: Payer: Self-pay

## 2017-07-23 ENCOUNTER — Ambulatory Visit: Payer: BC Managed Care – PPO | Admitting: Podiatry

## 2017-07-23 ENCOUNTER — Encounter (HOSPITAL_BASED_OUTPATIENT_CLINIC_OR_DEPARTMENT_OTHER): Payer: Self-pay | Admitting: *Deleted

## 2017-07-23 DIAGNOSIS — E119 Type 2 diabetes mellitus without complications: Secondary | ICD-10-CM | POA: Insufficient documentation

## 2017-07-23 DIAGNOSIS — N12 Tubulo-interstitial nephritis, not specified as acute or chronic: Secondary | ICD-10-CM

## 2017-07-23 DIAGNOSIS — Z79899 Other long term (current) drug therapy: Secondary | ICD-10-CM | POA: Insufficient documentation

## 2017-07-23 DIAGNOSIS — N760 Acute vaginitis: Secondary | ICD-10-CM

## 2017-07-23 DIAGNOSIS — R1084 Generalized abdominal pain: Secondary | ICD-10-CM

## 2017-07-23 DIAGNOSIS — R112 Nausea with vomiting, unspecified: Secondary | ICD-10-CM | POA: Insufficient documentation

## 2017-07-23 LAB — CBC WITH DIFFERENTIAL/PLATELET
BASOS ABS: 0 10*3/uL (ref 0.0–0.1)
BASOS PCT: 0 %
EOS ABS: 0.1 10*3/uL (ref 0.0–0.7)
Eosinophils Relative: 1 %
HEMATOCRIT: 39.5 % (ref 36.0–46.0)
Hemoglobin: 13.6 g/dL (ref 12.0–15.0)
Lymphocytes Relative: 20 %
Lymphs Abs: 2 10*3/uL (ref 0.7–4.0)
MCH: 29.8 pg (ref 26.0–34.0)
MCHC: 34.4 g/dL (ref 30.0–36.0)
MCV: 86.4 fL (ref 78.0–100.0)
MONO ABS: 0.6 10*3/uL (ref 0.1–1.0)
MONOS PCT: 6 %
NEUTROS ABS: 7.4 10*3/uL (ref 1.7–7.7)
NEUTROS PCT: 73 %
Platelets: 343 10*3/uL (ref 150–400)
RBC: 4.57 MIL/uL (ref 3.87–5.11)
RDW: 12.5 % (ref 11.5–15.5)
WBC: 10.2 10*3/uL (ref 4.0–10.5)

## 2017-07-23 LAB — URINALYSIS, ROUTINE W REFLEX MICROSCOPIC
Bilirubin Urine: NEGATIVE
GLUCOSE, UA: 100 mg/dL — AB
KETONES UR: 15 mg/dL — AB
LEUKOCYTES UA: NEGATIVE
Nitrite: NEGATIVE
Specific Gravity, Urine: >1.03 — ABNORMAL HIGH (ref 1.005–1.030)
pH: 5.5 (ref 5.0–8.0)

## 2017-07-23 LAB — COMPREHENSIVE METABOLIC PANEL
ALK PHOS: 74 U/L (ref 38–126)
ALT: 19 U/L (ref 14–54)
ANION GAP: 11 (ref 5–15)
AST: 20 U/L (ref 15–41)
Albumin: 4.2 g/dL (ref 3.5–5.0)
BILIRUBIN TOTAL: 0.4 mg/dL (ref 0.3–1.2)
BUN: 13 mg/dL (ref 6–20)
CALCIUM: 9.4 mg/dL (ref 8.9–10.3)
CO2: 23 mmol/L (ref 22–32)
Chloride: 101 mmol/L (ref 101–111)
Creatinine, Ser: 0.75 mg/dL (ref 0.44–1.00)
GFR calc Af Amer: >60 mL/min (ref >60)
GLUCOSE: 204 mg/dL — AB (ref 65–99)
POTASSIUM: 3.9 mmol/L (ref 3.5–5.1)
Sodium: 135 mmol/L (ref 135–145)
Total Protein: 8.2 g/dL — ABNORMAL HIGH (ref 6.5–8.1)

## 2017-07-23 LAB — URINE CULTURE

## 2017-07-23 LAB — URINALYSIS, MICROSCOPIC (REFLEX)

## 2017-07-23 LAB — PREGNANCY, URINE: PREG TEST UR: NEGATIVE

## 2017-07-23 MED ORDER — FLUCONAZOLE 150 MG PO TABS: 150.0000 mg | ORAL_TABLET | Freq: Every day | ORAL | 0 refills | Status: AC

## 2017-07-23 MED ORDER — METOCLOPRAMIDE HCL 10 MG PO TABS: 10.0000 mg | ORAL_TABLET | Freq: Four times a day (QID) | ORAL | 0 refills | Status: AC | PRN

## 2017-07-23 MED ORDER — SODIUM CHLORIDE 0.9 % IV BOLUS
1000.0000 mL | Freq: Once | INTRAVENOUS | Status: AC
  Administered 2017-07-23: 1000 mL via INTRAVENOUS

## 2017-07-23 MED ORDER — KETOROLAC TROMETHAMINE 30 MG/ML IJ SOLN
30.0000 mg | Freq: Once | INTRAMUSCULAR | Status: AC
  Administered 2017-07-23: 30 mg via INTRAVENOUS
  Filled 2017-07-23: qty 1

## 2017-07-23 MED ORDER — METOCLOPRAMIDE HCL 5 MG/ML IJ SOLN
10.0000 mg | Freq: Once | INTRAMUSCULAR | Status: AC
  Administered 2017-07-23: 10 mg via INTRAVENOUS
  Filled 2017-07-23: qty 2

## 2017-07-23 MED ORDER — ONDANSETRON 4 MG PO TBDP
4.0000 mg | ORAL_TABLET | Freq: Once | ORAL | Status: AC | PRN
  Administered 2017-07-23: 4 mg via ORAL
  Filled 2017-07-23: qty 1

## 2017-07-23 MED ORDER — PHENAZOPYRIDINE HCL 95 MG PO TABS: 95.0000 mg | ORAL_TABLET | Freq: Three times a day (TID) | ORAL | 0 refills | Status: AC | PRN

## 2017-07-23 MED ORDER — SODIUM CHLORIDE 0.9 % IV SOLN
1.0000 g | Freq: Once | INTRAVENOUS | Status: AC
  Administered 2017-07-23: 1 g via INTRAVENOUS
  Filled 2017-07-23: qty 10

## 2017-07-23 NOTE — ED Triage Notes (Signed)
She was seen here last night with abdominal pain and diagnosed with UTI. She has been taking an antibiotic since yesterday. She is having abdominal and back pain.

## 2017-07-23 NOTE — Discharge Instructions (Signed)
Please use Reglan for nausea, continue taking your Keflex 4 times a day as directed.  You may take fluconazole once daily and then again in 72 hours if vaginal irritation persists.  You may use Pyridium to help with bladder spasm and pain.  We will need to follow-up with GI for chronic abdominal pain.  Return to the emergency department for persistent fevers, worsening pain, nausea vomiting and unable to keep down fluids or your antibiotics or other new or concerning symptoms.  Please follow up with your primary doctor in the new 4 days for recheck.

## 2017-07-23 NOTE — ED Notes (Signed)
Doing PO challenge with Pt. At present time

## 2017-07-23 NOTE — ED Provider Notes (Addendum)
[facility] EMERGENCY DEPARTMENT Provider Note   CSN: [id number] Arrival date & time: 07/23/17  1422     History   Chief Complaint Chief Complaint  Patient presents with  . Abdominal Pain    HPI Tiffany Velazquez is a 47 y.o. female.  Tiffany Velazquez is a 47 y.o. Female with a history of diabetes, who presents to the ED for evaluation of periumbilical and suprapubic abdominal pain, nausea and vomiting. Pt was seen here in the ED last night for similar symptoms, had recently been diagnosed with UTI, and after workup yesterday she was diagnosed with pyelonephritis, pain and nausea treated with Toradol and phenergan and pt changed from Cipro to course of Keflex. Pt reports she was initially feeling better but when she got home vomiting returned and she has been unable to keep down her antibiotics despite zofran. She also reports return of abdominal pain, she has not been taking anything for pain at home. Pt reports she has been having abdominal pain like this intermittently for about 3 years and "wants to get to the bottom of it". She has had upper endoscopy and colonoscopy and seen GI without clear reason for pain discovered. Pt reports it comes and goes. Pt continues to have some mild dysuria and back pain, denies fevers or chills, denies vaginal discharge. Pt does reports she's had some vaginal irritation and redness since starting on antibiotics.      Past Medical History:  Diagnosis Date  . Diabetes mellitus without complication (HCC)     There are no active problems to display for this patient.   Past Surgical History:  Procedure Laterality Date  . CESAREAN SECTION    . ORTHOPEDIC SURGERY    . TONSILLECTOMY       OB History   None      Home Medications    Prior to Admission medications   Medication Sig Start Date End Date Taking? Authorizing Provider  acetaminophen-codeine (TYLENOL #3) 300-30 MG tablet Take 1-2 tablets by mouth every 6 (six) hours as needed for  moderate pain. 06/21/16   Felecia Shelling, DPM  atorvastatin (LIPITOR) 40 MG tablet Take 40 mg by mouth. 09/20/15   [provider]  cephALEXin (KEFLEX) 500 MG capsule Take 1 capsule (500 mg total) by mouth 4 (four) times daily for 7 days. 07/22/17 07/29/17  Mackuen, Courteney Lyn, MD  chlorpheniramine-HYDROcodone (TUSSIONEX) 10-8 MG/5ML LQCR Take 5 mLs by mouth every 12 (twelve) hours as needed for cough. Patient not taking: Reported on 02/28/2016 04/25/13   Reuben Likes, MD  cyclobenzaprine (FLEXERIL) 10 MG tablet Take 1 tablet (10 mg total) by mouth 2 (two) times daily as needed for muscle spasms. Patient not taking: Reported on 02/28/2016 05/10/12   Copland, Gwenlyn Found, MD  doxycycline (VIBRA-TABS) 100 MG tablet Take 1 tablet (100 mg total) by mouth 2 (two) times daily. 06/21/16   Felecia Shelling, DPM  fluconazole (DIFLUCAN) 150 MG tablet Take 1 tablet (150 mg total) by mouth daily. Take one tablet, if vaginal irritation persists may repeat every 72 hours 07/23/17   Dartha Lodge, PA-C  gabapentin (NEURONTIN) 100 MG capsule Take 1 capsule (100 mg total) by mouth 3 (three) times daily. 07/24/16   Felecia Shelling, DPM  gentamicin cream (GARAMYCIN) 0.1 % Apply 1 application topically 3 (three) times daily. 06/05/16   Felecia Shelling, DPM  ibuprofen (ADVIL,MOTRIN) 800 MG tablet Take 1 tablet (800 mg total) by mouth every 8 (eight) hours as needed  for pain. Patient not taking: Reported on 02/28/2016 05/10/12   Copland, Gwenlyn Found, MD  Insulin Glargine (LANTUS [place]) Inject into the skin.    [provider]  Insulin Lispro, Human, (HUMALOG [place]) Inject into the skin.    [provider]  JANUMET 50-1000 MG tablet Take 1 tablet by mouth 2 (two) times daily. 02/05/16   [provider]  levofloxacin (LEVAQUIN) 750 MG tablet Take 1 tablet (750 mg total) by mouth daily. Patient not taking: Reported on 02/28/2016 04/25/13   Reuben Likes, MD  meloxicam (MOBIC) 15 MG tablet TAKE 1 TABLET  BY MOUTH EVERY DAY 07/04/16   Felecia Shelling, DPM  METFORMIN HCL PO Take by mouth.    [provider]  metoCLOPramide (REGLAN) 10 MG tablet Take 1 tablet (10 mg total) by mouth every 6 (six) hours as needed for nausea (nausea/headache). 07/23/17   Dartha Lodge, PA-C  NONFORMULARY OR COMPOUNDED ITEM Apply 1-2 g topically 4 (four) times daily. 02/28/16   Felecia Shelling, DPM  NONFORMULARY OR COMPOUNDED ITEM [facility]  Peripheral Neuropathy Cream- Bupivacaine 1%, Doxepin 3%, Gabapentin 6%, Pentoxifylline 3%, Topiramate 1% Apply 1-2 grams to affected area 3-4 times daily Qty. 120 gm 3 refills 07/24/16   Felecia Shelling, DPM  NONFORMULARY OR COMPOUNDED ITEM [facility]: Peripheral Neuropathy cream - Bupivacaine 15, Doxepin 3%, Gabapentin 6%, Pentoxifylline 3%, Topiramate 1%, apply 1-2 grams to affected area 3-4 times daily. 07/25/16   Felecia Shelling, DPM  ondansetron (ZOFRAN) 4 MG tablet Take 1 tablet (4 mg total) by mouth every 8 (eight) hours as needed for nausea or vomiting. 07/22/17   Mackuen, Courteney Lyn, MD  oxyCODONE-acetaminophen (PERCOCET/ROXICET) 5-325 MG tablet Take 1 tablet by mouth every 4 (four) hours as needed for severe pain. 05/04/16   Felecia Shelling, DPM  phenazopyridine (PYRIDIUM) 95 MG tablet Take 1 tablet (95 mg total) by mouth 3 (three) times daily as needed for pain. 07/23/17   Dartha Lodge, PA-C  RAMIPRIL PO Take by mouth.    [provider]  traMADol (ULTRAM) 50 MG tablet Take 1 tablet (50 mg total) by mouth every 8 (eight) hours as needed. 05/10/16   Felecia Shelling, DPM  VICTOZA 18 MG/3ML SOPN INJECT 0.2 MLS (1.2 MG TOTAL) INTO THE SKIN DAILY AS DIRECTED 02/15/16   [provider]    Family History No family history on file.  Social History Social History   Tobacco Use  . Smoking status: Never Smoker  . Smokeless tobacco: Never Used  Substance Use Topics  . Alcohol use: No  . Drug use: No     Allergies   Penicillins and  Pioglitazone   Review of Systems Review of Systems  Constitutional: Negative for chills and fever.  HENT: Negative for congestion, rhinorrhea and sore throat.   Respiratory: Negative for cough and shortness of breath.   Cardiovascular: Negative for chest pain.  Gastrointestinal: Positive for abdominal pain, nausea and vomiting. Negative for blood in stool, constipation and diarrhea.  Genitourinary: Positive for dysuria and vaginal pain. Negative for flank pain, frequency, hematuria, menstrual problem, pelvic pain, vaginal bleeding and vaginal discharge.  Musculoskeletal: Positive for back pain. Negative for arthralgias and myalgias.  Skin: Negative for color change and rash.  Neurological: Negative for dizziness and light-headedness.     Physical Exam Updated Vital Signs BP 121/79 (BP Location: Left Arm)   Pulse 85   Temp 99.3 F (37.4 C) (Oral)   Resp 16  Ht 5\' 3"  (1.6 m)   Wt 87.1 kg (192 lb)   SpO2 100%   BMI 34.01 kg/m   Physical Exam  Constitutional: She appears well-developed and well-nourished. No distress.  Pt is not ill-appearing, in NAD  HENT:  Head: Normocephalic and atraumatic.  Mouth/Throat: Oropharynx is clear and moist.  Eyes: Right eye exhibits no discharge. Left eye exhibits no discharge.  Neck: Neck supple.  Cardiovascular: Normal rate, regular rhythm and normal heart sounds.  Pulmonary/Chest: Effort normal and breath sounds normal. No stridor. No respiratory distress. She has no wheezes. She has no rales.  Respirations equal and unlabored, patient able to speak in full sentences, lungs clear to auscultation bilaterally  Abdominal: Soft. Bowel sounds are normal. She exhibits no distension and no mass. There is tenderness. There is no rebound and no guarding.  Generalized abdominal tenderness w/o guarding, rebound or peritoneal signs  Genitourinary:  Genitourinary Comments: Erythema and irritation of external genitalia, no discharge present, suggestive of  candidiasis  Musculoskeletal: She exhibits no edema.  Neurological: She is alert. Coordination normal.  Skin: Skin is warm and dry. Capillary refill takes less than 2 seconds. She is not diaphoretic.  Psychiatric: She has a normal mood and affect. Her behavior is normal.  Nursing note and vitals reviewed.    ED Treatments / Results  Labs (all labs ordered are listed, but only abnormal results are displayed) Labs Reviewed  COMPREHENSIVE METABOLIC PANEL - Abnormal; Notable for the following components:      Result Value   Glucose, Bld 204 (*)    Total Protein 8.2 (*)    All other components within normal limits  URINALYSIS, ROUTINE W REFLEX MICROSCOPIC - Abnormal; Notable for the following components:   APPearance HAZY (*)    Specific Gravity, Urine >1.030 (*)    Glucose, UA 100 (*)    Hgb urine dipstick MODERATE (*)    Ketones, ur 15 (*)    Protein, ur >300 (*)    All other components within normal limits  URINALYSIS, MICROSCOPIC (REFLEX) - Abnormal; Notable for the following components:   Bacteria, UA FEW (*)    Squamous Epithelial / LPF 0-5 (*)    All other components within normal limits  PREGNANCY, URINE  CBC WITH DIFFERENTIAL/PLATELET    EKG None  Radiology No results found.  Procedures Procedures (including critical care time)  Medications Ordered in ED Medications  ondansetron (ZOFRAN-ODT) disintegrating tablet 4 mg (4 mg Oral Given 07/23/17 1817)  sodium chloride 0.9 % bolus 1,000 mL (1,000 mLs Intravenous Given 07/23/17 2007)  ketorolac (TORADOL) 30 MG/ML injection 30 mg (30 mg Intravenous Given 07/23/17 2006)  metoCLOPramide (REGLAN) injection 10 mg (10 mg Intravenous Given 07/23/17 2006)  cefTRIAXone (ROCEPHIN) 1 g in sodium chloride 0.9 % 100 mL IVPB (0 g Intravenous Stopped 07/23/17 2151)     Initial Impression / Assessment and Plan / ED Course  I have reviewed the triage vital signs and the nursing notes.  Pertinent labs & imaging results that were  available during my care of the patient were reviewed by me and considered in my medical decision making (see chart for details).  Pt presents with abdominal pain, and persistent nausea and vomiting despite zofran. Seen and diagnosed with pyelonephritis last night in ED, unable to keep down keflex. Pt with hx of chronic abdominal pain that is likely contributing as well. Vitals normal and pt non-toxic appearing. Pt with mild generalized abdominal tenderness, no focal findings to suggest peritonitis or acute  surgical abdomen. Pt had CT scan yesterday do not feel additional imaging is warranted.  Labs reviewed and very reassuring when compared to labs evaluation from yesterday. WBC count is 10.2, no increase to suggest worsening infection. Glucose if elevated at 204, no other electrolyte derangements, no anion gap. Urinalysis shows improvement in infection. Normal kidney function.   Will give IV fluids, toradol and reglan, given hx of DM this may provide better control of nausea. Had long discussion with pt regarding chronic abdominal pain, pt requests referral to GI for second opinion.  On re-eval pt is feeling much better and tolerating PO fluids. Plan to discharge home with reglan, pyridium for bladder spasm which may be causing worsening pain and fluconazole for vaginal irritation, pt likely with candidiasis given DM and abx use. Pt to follow up with PCP and GI. Return precautions discussed. Pt expresses understanding and is in agreement with plan.  Pt discussed with Dr. Fayrene FearingJames who agrees with plan.  Final Clinical Impressions(s) / ED Diagnoses   Final diagnoses:  Pyelonephritis  Generalized abdominal pain  Non-intractable vomiting with nausea, unspecified vomiting type  Acute vaginitis    ED Discharge Orders        Ordered    metoCLOPramide (REGLAN) 10 MG tablet  Every 6 hours PRN     07/23/17 2243    fluconazole (DIFLUCAN) 150 MG tablet  Daily     07/23/17 2243    phenazopyridine  (PYRIDIUM) 95 MG tablet  3 times daily PRN     07/23/17 2243           Dartha LodgeFord, Hollie Bartus N, PA-C 07/24/17 1443    Rolland PorterJames, Mark, MD 07/24/17 1526

## 2017-07-23 NOTE — ED Notes (Signed)
Patient given diet gingerale for po challenge 

## 2017-07-23 NOTE — ED Notes (Signed)
PA [employer] Energy[employer]ord Stated to hold off on the fluid PO Challenge, [employer] Misty informed

## 2017-07-26 ENCOUNTER — Ambulatory Visit: Payer: BC Managed Care – PPO | Admitting: Gastroenterology

## 2017-08-15 ENCOUNTER — Ambulatory Visit: Payer: BC Managed Care – PPO | Admitting: Podiatry

## 2017-08-15 ENCOUNTER — Encounter: Payer: Self-pay | Admitting: Podiatry

## 2017-08-15 ENCOUNTER — Ambulatory Visit (INDEPENDENT_AMBULATORY_CARE_PROVIDER_SITE_OTHER): Payer: BC Managed Care – PPO

## 2017-08-15 DIAGNOSIS — M722 Plantar fascial fibromatosis: Secondary | ICD-10-CM | POA: Diagnosis not present

## 2017-08-15 DIAGNOSIS — R52 Pain, unspecified: Secondary | ICD-10-CM

## 2017-08-15 MED ORDER — METHYLPREDNISOLONE 4 MG PO TBPK: ORAL_TABLET | ORAL | 0 refills | Status: AC

## 2017-08-15 MED ORDER — MELOXICAM 15 MG PO TABS: 15.0000 mg | ORAL_TABLET | Freq: Every day | ORAL | 1 refills | Status: AC

## 2017-08-21 NOTE — Progress Notes (Signed)
   Subjective: Patient presents today for pain and tenderness in the lateral aspect of the left foot that began 2 months ago. She states the pain is worse with the first few steps out of bed in the morning. Walking and standing for long periods of time increases the pain. She has not done anything to treat the symptoms. Patient presents today for further treatment and evaluation.  Past Medical History:  Diagnosis Date  . Diabetes mellitus without complication (HCC)      Objective: Physical Exam General: The patient is alert and oriented x3 in no acute distress.  Dermatology: Skin is warm, dry and supple bilateral lower extremities. Negative for open lesions or macerations bilateral.   Vascular: Dorsalis Pedis and Posterior Tibial pulses palpable bilateral.  Capillary fill time is immediate to all digits.  Neurological: Epicritic and protective threshold intact bilateral.   Musculoskeletal: Tenderness to palpation to the plantar aspect of the left heel along the plantar fascia. All other joints range of motion within normal limits bilateral. Strength 5/5 in all groups bilateral.   Radiographic exam:   Normal osseous mineralization. Joint spaces preserved. No fracture/dislocation/boney destruction. No other soft tissue abnormalities or radiopaque foreign bodies.   Assessment: 1. Plantar fasciitis left foot - lateral band   Plan of Care:  1. Patient evaluated. Xrays reviewed.   2. Injection of 0.5cc Celestone soluspan injected into the left plantar fascia.  3. Rx for Medrol Dose Pak placed 4. Rx for Meloxicam ordered for patient. 5. Plantar fascial band(s) dispensed  6. Instructed patient regarding therapies and modalities at home to alleviate symptoms.  7. Return to clinic in 4 weeks.     Felecia ShellingBrent M. Dorothey Oetken, DPM [facility]  Dr. Felecia ShellingBrent M. Myisha Pickerel, DPM    [address]                                     [place], [place] [address]                Office [phone]    Fax [phone]

## 2017-09-07 ENCOUNTER — Ambulatory Visit: Payer: BC Managed Care – PPO | Admitting: Gastroenterology

## 2017-09-12 ENCOUNTER — Ambulatory Visit: Payer: BC Managed Care – PPO | Admitting: Podiatry

## 2017-12-03 ENCOUNTER — Ambulatory Visit: Payer: BC Managed Care – PPO | Admitting: Podiatry

## 2017-12-12 ENCOUNTER — Ambulatory Visit (INDEPENDENT_AMBULATORY_CARE_PROVIDER_SITE_OTHER): Payer: BC Managed Care – PPO | Admitting: Podiatry

## 2017-12-12 DIAGNOSIS — M722 Plantar fascial fibromatosis: Secondary | ICD-10-CM

## 2017-12-14 NOTE — Progress Notes (Signed)
   Subjective: 47 year old female with PMHx of DM presenting today for follow up evaluation of plantar fasciitis of the left foot. She states she still has pain when walking. She states the pain is worse after trying to walk after sitting for a long period of time. She has been taking Meloxicam for treatment. Patient is here for further evaluation and treatment.   Past Medical History:  Diagnosis Date  . Diabetes mellitus without complication (HCC)      Objective: Physical Exam General: The patient is alert and oriented x3 in no acute distress.  Dermatology: Skin is warm, dry and supple bilateral lower extremities. Negative for open lesions or macerations bilateral.   Vascular: Dorsalis Pedis and Posterior Tibial pulses palpable bilateral.  Capillary fill time is immediate to all digits.  Neurological: Epicritic and protective threshold intact bilateral.   Musculoskeletal: Tenderness to palpation to the plantar aspect of the left heel along the plantar fascia. All other joints range of motion within normal limits bilateral. Strength 5/5 in all groups bilateral.    Assessment: 1. Plantar fasciitis left foot  2. Plantar heel spur left   Plan of Care:  1. Patient evaluated. Xrays reviewed.   2. Injection of 0.5cc Celestone soluspan injected into the left plantar fascia.  3. Plantar fascial band(s) dispensed  4. Continue taking Meloxicam as needed.  5. Discussed surgery as an option for the future. Patient will let me know when she would like to have it done.  6. Return to clinic as needed.   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     Felecia ShellingBrent M. Marketta Valadez, DPM [facility]  Dr. Felecia ShellingBrent M. Amberlynn Tempesta, DPM    [address]                                     [place], [place] [address]                Office [phone]  Fax [phone]

## 2018-01-26 ENCOUNTER — Ambulatory Visit (HOSPITAL_COMMUNITY)
Admission: EM | Admit: 2018-01-26 | Discharge: 2018-01-26 | Disposition: A | Discharge status: Home / Self Care | Payer: Worker's Compensation | Attending: Family Medicine | Admitting: Family Medicine

## 2018-01-26 ENCOUNTER — Encounter (HOSPITAL_COMMUNITY): Payer: Self-pay | Admitting: Emergency Medicine

## 2018-01-26 DIAGNOSIS — R109 Unspecified abdominal pain: Secondary | ICD-10-CM

## 2018-01-26 DIAGNOSIS — L304 Erythema intertrigo: Secondary | ICD-10-CM

## 2018-01-26 NOTE — ED Triage Notes (Signed)
Pt states she was at work lifting boxes and felt something pop in her lower abdomen. Pt states she feels like something is bulging out. Pt has hx of c section. Pt concerned for hernia.

## 2018-01-26 NOTE — Discharge Instructions (Signed)
You may use over the counter clotrimazole cream twice daily for at least the next week.

## 2018-01-26 NOTE — ED Provider Notes (Signed)
[facility] CARE CENTER   [id number] 01/26/18 Arrival Time: 1033  ASSESSMENT & PLAN:  1. Abdominal discomfort   2. Intertrigo     Follow-up Information    Schedule an appointment as soon as possible for a visit  with Andreas Blower., MD.   Specialty:  Internal Medicine Contact information: [address] [place] [address] [phone]        [facility] EMERGENCY DEPARTMENT.   Specialty:  Emergency Medicine Why:  If symptoms worsen. Contact information: [address] [id number] [place] [address] [phone]           Discharge Instructions     You may use over the counter clotrimazole cream twice daily for at least the next week.   No obvious hernia. Question lower abdominal muscle strain. Abdominal pain precautions given and discussed. Observe. Keep area of intertrigo clean and dry.  Reviewed expectations re: course of current medical issues. Questions answered. Outlined signs and symptoms indicating need for more acute intervention. Patient verbalized understanding. After Visit Summary given.   SUBJECTIVE:  Tiffany Velazquez is a 47 y.o. female who presents with complaint of intermittent abdominal discomfort. Onset gradual, over at least several days; reports lifting something at work "and felt something pop" in her lower abdomen; sore since. Location: lower abdomen without radiation. Described as a soreness. Symptoms are stable since beginning. Aggravating factors: movement. Alleviating factors: rest. Associated symptoms: none. She denies belching, constipation, diarrhea, dysuria, fever, nausea and vomiting. Is passing gas. Appetite: normal. PO intake: normal. Ambulatory without assistance. Urinary symptoms: none. Last bowel movement yesterday without blood. OTC treatment: none.  Also reports a rash; anterior waist; gradual onset over several days, maybe longer; mild discomfort and itching; afebrile. No  specific aggravating or alleviating factors reported. No h/o similar. No drainage or odor.  No LMP recorded. (Menstrual status: IUD).   Past Surgical History:  Procedure Laterality Date  . CESAREAN SECTION    . ORTHOPEDIC SURGERY    . TONSILLECTOMY      ROS: As per HPI. All other systems negative.  OBJECTIVE:  Vitals:   01/26/18 1056  BP: (!) 141/91  Pulse: 81  Resp: 16  Temp: 98.3 F (36.8 C)  SpO2: 98%    General appearance: alert; no distress Lungs: clear to auscultation bilaterally Heart: regular rate and rhythm Abdomen: soft; non-distended; mild tenderness over lower abdomen without obvious hernia; healed transverse c-section scar; bowel sounds present; no masses or organomegaly; no guarding or rebound tenderness Back: no CVA tenderness; FROM at hips Extremities: no edema; symmetrical with no gross deformities Skin: warm and dry; intertrigo at waist abdominal fold Neurologic: normal gait Psychological: alert and cooperative; normal mood and affect    Allergies  Allergen Reactions  . Penicillins   . Pioglitazone Nausea And Vomiting                                               Past Medical History:  Diagnosis Date  . Diabetes mellitus without complication [facility])    Social History   Socioeconomic History  . Marital status: Married    Spouse name: Not on file  . Number of children: Not on file  . Years of education: Not on file  . Highest education level: Not on file  Occupational History  . Not on file  Social Needs  .  Financial resource strain: Not on file  . Food insecurity:    Worry: Not on file    Inability: Not on file  . Transportation needs:    Medical: Not on file    Non-medical: Not on file  Tobacco Use  . Smoking status: Never Smoker  . Smokeless tobacco: Never Used  Substance and Sexual Activity  . Alcohol use: No  . Drug use: No  . Sexual activity: Yes    Birth control/protection: Condom  Lifestyle  . Physical activity:    Days  per week: Not on file    Minutes per session: Not on file  . Stress: Not on file  Relationships  . Social connections:    Talks on phone: Not on file    Gets together: Not on file    Attends religious service: Not on file    Active member of club or organization: Not on file    Attends meetings of clubs or organizations: Not on file    Relationship status: Not on file  . Intimate partner violence:    Fear of current or ex partner: Not on file    Emotionally abused: Not on file    Physically abused: Not on file    Forced sexual activity: Not on file  Other Topics Concern  . Not on file  Social History Narrative  . Not on file   FH: No colon disease.   Mardella Layman, MD 02/05/18 1046

## 2018-02-13 ENCOUNTER — Ambulatory Visit: Payer: BC Managed Care – PPO | Admitting: Podiatry

## 2018-07-04 ENCOUNTER — Emergency Department (HOSPITAL_BASED_OUTPATIENT_CLINIC_OR_DEPARTMENT_OTHER)
Admission: EM | Admit: 2018-07-04 | Discharge: 2018-07-04 | Disposition: A | Discharge status: Home / Self Care | Payer: BC Managed Care – PPO | Attending: Emergency Medicine | Admitting: Emergency Medicine

## 2018-07-04 ENCOUNTER — Other Ambulatory Visit: Payer: Self-pay

## 2018-07-04 ENCOUNTER — Emergency Department (HOSPITAL_BASED_OUTPATIENT_CLINIC_OR_DEPARTMENT_OTHER): Payer: BC Managed Care – PPO

## 2018-07-04 ENCOUNTER — Encounter (HOSPITAL_BASED_OUTPATIENT_CLINIC_OR_DEPARTMENT_OTHER): Payer: Self-pay | Admitting: Emergency Medicine

## 2018-07-04 DIAGNOSIS — R112 Nausea with vomiting, unspecified: Secondary | ICD-10-CM | POA: Diagnosis not present

## 2018-07-04 DIAGNOSIS — E119 Type 2 diabetes mellitus without complications: Secondary | ICD-10-CM | POA: Diagnosis not present

## 2018-07-04 DIAGNOSIS — J069 Acute upper respiratory infection, unspecified: Secondary | ICD-10-CM | POA: Diagnosis not present

## 2018-07-04 DIAGNOSIS — R197 Diarrhea, unspecified: Secondary | ICD-10-CM | POA: Diagnosis not present

## 2018-07-04 DIAGNOSIS — B9789 Other viral agents as the cause of diseases classified elsewhere: Secondary | ICD-10-CM

## 2018-07-04 DIAGNOSIS — R05 Cough: Secondary | ICD-10-CM | POA: Diagnosis present

## 2018-07-04 LAB — CBC WITH DIFFERENTIAL/PLATELET
ABS IMMATURE GRANULOCYTES: 0.1 10*3/uL — AB (ref 0.00–0.07)
BASOS PCT: 0 %
Basophils Absolute: 0 10*3/uL (ref 0.0–0.1)
Eosinophils Absolute: 0.1 10*3/uL (ref 0.0–0.5)
Eosinophils Relative: 1 %
HCT: 37.7 % (ref 36.0–46.0)
Hemoglobin: 12.7 g/dL (ref 12.0–15.0)
Immature Granulocytes: 1 %
Lymphocytes Relative: 9 %
Lymphs Abs: 1.4 10*3/uL (ref 0.7–4.0)
MCH: 29.1 pg (ref 26.0–34.0)
MCHC: 33.7 g/dL (ref 30.0–36.0)
MCV: 86.5 fL (ref 80.0–100.0)
Monocytes Absolute: 0.9 10*3/uL (ref 0.1–1.0)
Monocytes Relative: 6 %
NEUTROS ABS: 13.4 10*3/uL — AB (ref 1.7–7.7)
NEUTROS PCT: 83 %
Platelets: 279 10*3/uL (ref 150–400)
RBC: 4.36 MIL/uL (ref 3.87–5.11)
RDW: 12.8 % (ref 11.5–15.5)
WBC: 15.9 10*3/uL — ABNORMAL HIGH (ref 4.0–10.5)
nRBC: 0 % (ref 0.0–0.2)

## 2018-07-04 LAB — COMPREHENSIVE METABOLIC PANEL
ALT: 20 U/L (ref 0–44)
ANION GAP: 11 (ref 5–15)
AST: 22 U/L (ref 15–41)
Albumin: 4.1 g/dL (ref 3.5–5.0)
Alkaline Phosphatase: 56 U/L (ref 38–126)
BUN: 21 mg/dL — ABNORMAL HIGH (ref 6–20)
CO2: 21 mmol/L — ABNORMAL LOW (ref 22–32)
Calcium: 9 mg/dL (ref 8.9–10.3)
Chloride: 102 mmol/L (ref 98–111)
Creatinine, Ser: 0.78 mg/dL (ref 0.44–1.00)
GFR calc Af Amer: >60 mL/min (ref >60)
GFR calc non Af Amer: >60 mL/min (ref >60)
Glucose, Bld: 199 mg/dL — ABNORMAL HIGH (ref 70–99)
Potassium: 4.2 mmol/L (ref 3.5–5.1)
Sodium: 134 mmol/L — ABNORMAL LOW (ref 135–145)
TOTAL PROTEIN: 7.3 g/dL (ref 6.5–8.1)
Total Bilirubin: 0.7 mg/dL (ref 0.3–1.2)

## 2018-07-04 LAB — TROPONIN I: Troponin I: <0.03 ng/mL (ref <0.03)

## 2018-07-04 MED ORDER — SODIUM CHLORIDE 0.9 % IV BOLUS
1000.0000 mL | Freq: Once | INTRAVENOUS | Status: AC
  Administered 2018-07-04: 1000 mL via INTRAVENOUS

## 2018-07-04 MED ORDER — ONDANSETRON 4 MG PO TBDP: 4.0000 mg | ORAL_TABLET | Freq: Three times a day (TID) | ORAL | 0 refills | Status: AC | PRN

## 2018-07-04 MED ORDER — ONDANSETRON HCL 4 MG/2ML IJ SOLN
4.0000 mg | Freq: Once | INTRAMUSCULAR | Status: AC
  Administered 2018-07-04: 4 mg via INTRAVENOUS
  Filled 2018-07-04: qty 2

## 2018-07-04 MED FILL — ONDANSETRON ODT 4 MG TABLET: 4 | 2 days supply | Qty: 6 | Fill #0

## 2018-07-04 NOTE — ED Notes (Signed)
Pt given diet ginger ale.

## 2018-07-04 NOTE — ED Provider Notes (Signed)
[facility] EMERGENCY DEPARTMENT Provider Note   CSN: [id number] Arrival date & time: 07/04/18  0820    History   Chief Complaint Chief Complaint  Patient presents with  . Shortness of Breath    HPI Tiffany Velazquez is a 48 y.o. female.     48yo F w/ PMH including IDDM who p/w cough, nasal congestion, sneezing, weakness, body aches, vomiting. On 2/22, she began getting sick with body aches, cough, sore throat, fever, and feeling weak. On 2/25, she was seen by PCP who diagnosed w/ viral URI, gave supportive meds for cough. She tested negative for influenza. She continued to feel worse so she went to [facility] She was given Z-pack which finished 3 days ago. She reports today symptoms are still present and have not improved. She reports that this morning around 3am, she began vomiting and has had multiple episodes since then. She reports associated stomach ache. She reports diarrhea this morning. No sick contacts or recent travel.   The history is provided by the patient.  Shortness of Breath    Past Medical History:  Diagnosis Date  . Diabetes mellitus without complication (HCC)     There are no active problems to display for this patient.   Past Surgical History:  Procedure Laterality Date  . CESAREAN SECTION    . ORTHOPEDIC SURGERY    . TONSILLECTOMY       OB History   No obstetric history on file.      Home Medications    Prior to Admission medications   Medication Sig Start Date End Date Taking? Authorizing Provider  acetaminophen-codeine (TYLENOL #3) 300-30 MG tablet Take 1-2 tablets by mouth every 6 (six) hours as needed for moderate pain. Patient not taking: Reported on 01/26/2018 06/21/16   Felecia Shelling, DPM  atorvastatin (LIPITOR) 40 MG tablet Take 40 mg by mouth. 09/20/15   [provider]  chlorpheniramine-HYDROcodone (TUSSIONEX) 10-8 MG/5ML LQCR Take 5 mLs by mouth every 12 (twelve) hours as needed for cough. Patient not taking:  Reported on 02/28/2016 04/25/13   Reuben Likes, MD  cyclobenzaprine (FLEXERIL) 10 MG tablet Take 1 tablet (10 mg total) by mouth 2 (two) times daily as needed for muscle spasms. Patient not taking: Reported on 02/28/2016 05/10/12   Copland, Gwenlyn Found, MD  doxycycline (VIBRA-TABS) 100 MG tablet Take 1 tablet (100 mg total) by mouth 2 (two) times daily. Patient not taking: Reported on 01/26/2018 06/21/16   Felecia Shelling, DPM  fenofibrate (TRICOR) 48 MG tablet Take 48 mg by mouth daily.    [provider]  fluconazole (DIFLUCAN) 150 MG tablet Take 1 tablet (150 mg total) by mouth daily. Take one tablet, if vaginal irritation persists may repeat every 72 hours Patient not taking: Reported on 01/26/2018 07/23/17   Dartha Lodge, PA-C  gabapentin (NEURONTIN) 100 MG capsule Take 1 capsule (100 mg total) by mouth 3 (three) times daily. Patient not taking: Reported on 01/26/2018 07/24/16   Felecia Shelling, DPM  gentamicin cream (GARAMYCIN) 0.1 % Apply 1 application topically 3 (three) times daily. 06/05/16   Felecia Shelling, DPM  ibuprofen (ADVIL,MOTRIN) 800 MG tablet Take 1 tablet (800 mg total) by mouth every 8 (eight) hours as needed for pain. Patient not taking: Reported on 02/28/2016 05/10/12   Copland, Gwenlyn Found, MD  Insulin Glargine (LANTUS Bells) Inject into the skin.    [provider]  Insulin Lispro, Human, (HUMALOG ) Inject into the skin.  [provider]  JANUMET 50-1000 MG tablet Take 1 tablet by mouth 2 (two) times daily. 02/05/16   [provider]  levofloxacin (LEVAQUIN) 750 MG tablet Take 1 tablet (750 mg total) by mouth daily. Patient not taking: Reported on 02/28/2016 04/25/13   Reuben Likes, MD  METFORMIN HCL PO Take by mouth.    [provider]  methylPREDNISolone (MEDROL DOSEPAK) 4 MG TBPK tablet 6 day dose pack - take as directed Patient not taking: Reported on 01/26/2018 08/15/17   Felecia Shelling, DPM  metoCLOPramide (REGLAN) 10 MG tablet  Take 1 tablet (10 mg total) by mouth every 6 (six) hours as needed for nausea (nausea/headache). 07/23/17   Dartha Lodge, PA-C  NONFORMULARY OR COMPOUNDED ITEM Apply 1-2 g topically 4 (four) times daily. 02/28/16   Felecia Shelling, DPM  NONFORMULARY OR COMPOUNDED ITEM [facility]  Peripheral Neuropathy Cream- Bupivacaine 1%, Doxepin 3%, Gabapentin 6%, Pentoxifylline 3%, Topiramate 1% Apply 1-2 grams to affected area 3-4 times daily Qty. 120 gm 3 refills 07/24/16   Felecia Shelling, DPM  NONFORMULARY OR COMPOUNDED ITEM [facility]: Peripheral Neuropathy cream - Bupivacaine 15, Doxepin 3%, Gabapentin 6%, Pentoxifylline 3%, Topiramate 1%, apply 1-2 grams to affected area 3-4 times daily. 07/25/16   Felecia Shelling, DPM  ondansetron (ZOFRAN ODT) 4 MG disintegrating tablet Take 1 tablet (4 mg total) by mouth every 8 (eight) hours as needed for nausea or vomiting. 07/04/18   Little, Ambrose Finland, MD  ondansetron (ZOFRAN) 4 MG tablet Take 1 tablet (4 mg total) by mouth every 8 (eight) hours as needed for nausea or vomiting. Patient not taking: Reported on 01/26/2018 07/22/17   Mackuen, Cindee Salt, MD  oxyCODONE-acetaminophen (PERCOCET/ROXICET) 5-325 MG tablet Take 1 tablet by mouth every 4 (four) hours as needed for severe pain. 05/04/16   Felecia Shelling, DPM  phenazopyridine (PYRIDIUM) 95 MG tablet Take 1 tablet (95 mg total) by mouth 3 (three) times daily as needed for pain. 07/23/17   Dartha Lodge, PA-C  RAMIPRIL PO Take by mouth.    [provider]  rosuvastatin (CRESTOR) 40 MG tablet rosuvastatin 40 mg tablet  TAKE 1 TABLET BY MOUTH EVERY DAY 12/04/17   [provider]  rosuvastatin (CRESTOR) 40 MG tablet Take 40 mg by mouth daily.    [provider]  traMADol (ULTRAM) 50 MG tablet Take 1 tablet (50 mg total) by mouth every 8 (eight) hours as needed. Patient not taking: Reported on 01/26/2018 05/10/16   Felecia Shelling, DPM  VICTOZA 18 MG/3ML SOPN INJECT 0.2 MLS (1.2 MG  TOTAL) INTO THE SKIN DAILY AS DIRECTED 02/15/16   [provider]    Family History History reviewed. No pertinent family history.  Social History Social History   Tobacco Use  . Smoking status: Never Smoker  . Smokeless tobacco: Never Used  Substance Use Topics  . Alcohol use: No  . Drug use: No     Allergies   Penicillins and Pioglitazone   Review of Systems Review of Systems  Respiratory: Positive for shortness of breath.    All other systems reviewed and are negative except that which was mentioned in HPI   Physical Exam Updated Vital Signs BP 134/88   Pulse 87   Temp 98.7 F (37.1 C) (Oral)   Resp 14   Ht  (1.6 m)   Wt 87.1 kg   SpO2 100%   BMI 34.01 kg/m   Physical Exam Vitals signs  and nursing note reviewed.  Constitutional:      General: She is not in acute distress.    Appearance: She is well-developed.  HENT:     Head: Normocephalic and atraumatic.     Mouth/Throat:     Mouth: Mucous membranes are moist.  Eyes:     Conjunctiva/sclera: Conjunctivae normal.  Neck:     Musculoskeletal: Neck supple.  Cardiovascular:     Rate and Rhythm: Normal rate and regular rhythm.     Heart sounds: Normal heart sounds. No murmur.  Pulmonary:     Effort: Pulmonary effort is normal.     Breath sounds: Normal breath sounds.  Abdominal:     General: Bowel sounds are normal. There is no distension.     Palpations: Abdomen is soft.     Tenderness: There is no abdominal tenderness.  Musculoskeletal:     Right lower leg: No edema.     Left lower leg: No edema.  Skin:    General: Skin is warm and dry.     Findings: No rash.  Neurological:     Mental Status: She is alert and oriented to person, place, and time.     Comments: Fluent speech  Psychiatric:        Mood and Affect: Mood normal.        Judgment: Judgment normal.      ED Treatments / Results  Labs (all labs ordered are listed, but only abnormal results are displayed) Labs  Reviewed  COMPREHENSIVE METABOLIC PANEL - Abnormal; Notable for the following components:      Result Value   Sodium 134 (*)    CO2 21 (*)    Glucose, Bld 199 (*)    BUN 21 (*)    All other components within normal limits  CBC WITH DIFFERENTIAL/PLATELET - Abnormal; Notable for the following components:   WBC 15.9 (*)    Neutro Abs 13.4 (*)    Abs Immature Granulocytes 0.10 (*)    All other components within normal limits  TROPONIN I    EKG EKG Interpretation  Date/Time:  Thursday July 04 2018 08:37:37 EST Ventricular Rate:  110 PR Interval:    QRS Duration: 89 QT Interval:  331 QTC Calculation: 448 R Axis:   124 Text Interpretation:  Sinus tachycardia Right axis deviation No previous ECGs available Confirmed by Frederick Peers [id number]) on 07/04/2018 9:56:06 AM   Radiology Dg Chest 2 View  Result Date: 07/04/2018 CLINICAL DATA:  Cough EXAM: CHEST - 2 VIEW COMPARISON:  04/25/2013 FINDINGS: Normal heart size and mediastinal contours. No acute infiltrate or edema. No effusion or pneumothorax. No acute osseous findings. IMPRESSION: Negative chest. Electronically Signed   By: Marnee Spring M.D.   On: 07/04/2018 09:12    Procedures Procedures (including critical care time)  Medications Ordered in ED Medications  sodium chloride 0.9 % bolus 1,000 mL ( Intravenous Stopped 07/04/18 1027)  ondansetron (ZOFRAN) injection 4 mg (4 mg Intravenous Given 07/04/18 0925)     Initial Impression / Assessment and Plan / ED Course  I have reviewed the triage vital signs and the nursing notes.  Pertinent labs & imaging results that were available during my care of the patient were reviewed by me and considered in my medical decision making (see chart for details).       She was nontoxic on exam with reassuring vital signs, afebrile.  No abdominal tenderness, no wheezing on exam.  Chest x-ray is normal and with normal O2 saturation  and normal breath sounds, I do not feel she needs another  course of antibiotics as her upper respiratory symptoms suggest a viral process. No risk factors for PE and sx highly suggest virus. Regarding vomiting and diarrhea, CMP is reassuring with no evidence of dehydration or DKA.  CBC notable for WBC 15.9 which may reflect viral illness.  She has no abdominal tenderness to warrant CT imaging at this time.  After receiving Zofran and fluid bolus, she is improved on reassessment and has been tolerating liquids.  Discussed supportive measures for her symptoms and PCP follow-up.  Reviewed return precautions and she voiced understanding.  Final Clinical Impressions(s) / ED Diagnoses   Final diagnoses:  Nausea vomiting and diarrhea  Viral URI with cough    ED Discharge Orders         Ordered    ondansetron (ZOFRAN ODT) 4 MG disintegrating tablet  Every 8 hours PRN     07/04/18 1058           Little, Ambrose Finland, MD 07/04/18 1109

## 2018-07-04 NOTE — ED Triage Notes (Addendum)
C/o productive cough with fever and body aches for several weeks. Now c/o shortness of breath and lethargy. Patient ambulatory to room in NAD.  Speaking in full sentences without difficulty.

## 2019-01-08 ENCOUNTER — Other Ambulatory Visit: Payer: Self-pay | Admitting: Rehabilitation

## 2019-01-08 DIAGNOSIS — M50321 Other cervical disc degeneration at C4-C5 level: Secondary | ICD-10-CM

## 2019-01-25 ENCOUNTER — Other Ambulatory Visit: Payer: Self-pay

## 2019-01-25 ENCOUNTER — Ambulatory Visit
Admission: RE | Admit: 2019-01-25 | Discharge: 2019-01-25 | Disposition: A | Discharge status: Home / Self Care | Payer: BC Managed Care – PPO | Source: Ambulatory Visit | Attending: Rehabilitation | Admitting: Rehabilitation

## 2019-01-25 DIAGNOSIS — M50321 Other cervical disc degeneration at C4-C5 level: Secondary | ICD-10-CM

## 2020-07-28 IMAGING — MR MR CERVICAL SPINE W/O CM
4 of 5 series · 29 of 48 positions shown · non-contrast
Comparison: 12/08/2016

CLINICAL DATA: Posterior neck pain with bilateral shoulder pain
over the last year.

EXAM:
MRI CERVICAL SPINE WITHOUT CONTRAST
TECHNIQUE: Multiplanar, multisequence MR imaging of the cervical spine was
performed. No intravenous contrast was administered.

[Series 6: T1 · sagittal · 3.0mm · 0.66mm/px · 8 of 19 slices shown]
[im 1/19]
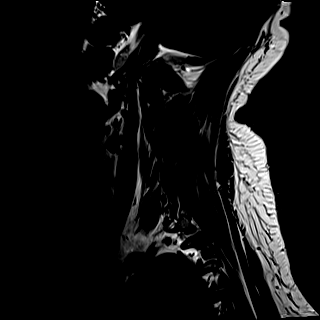
[im 3/19]
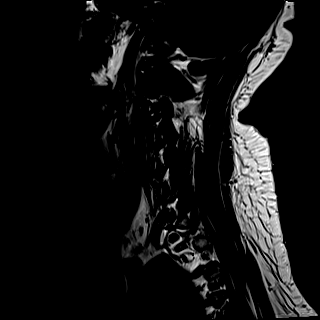
[im 6/19]
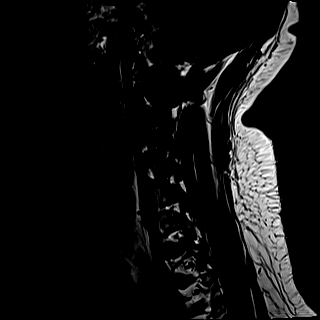
[im 8/19]
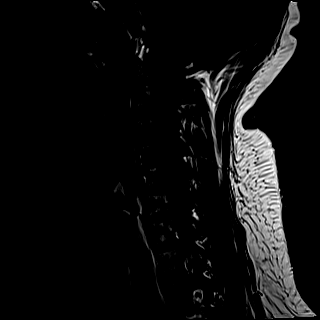
[im 11/19]
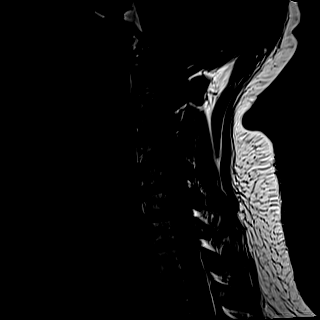
[im 13/19]
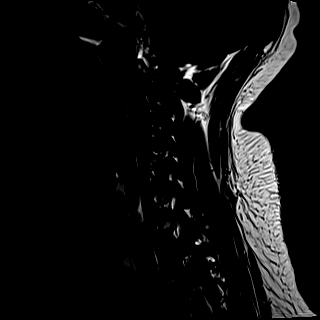
[im 16/19]
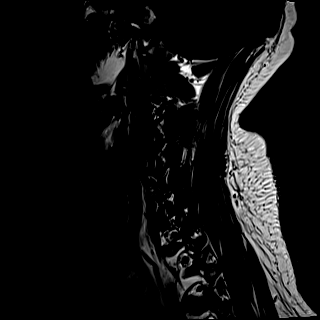
[im 19/19]
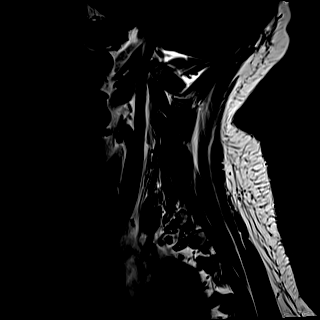

[Series 7: T2 · sagittal · 3.0mm · 0.55mm/px · 7 of 19 slices shown (1 of 2)]
[im 1/19]
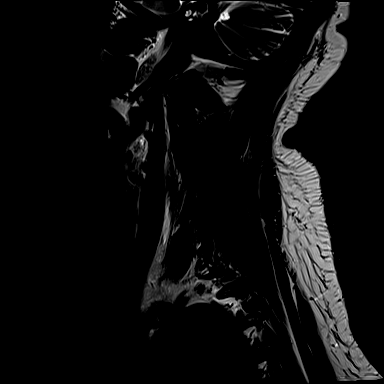
[im 4/19]
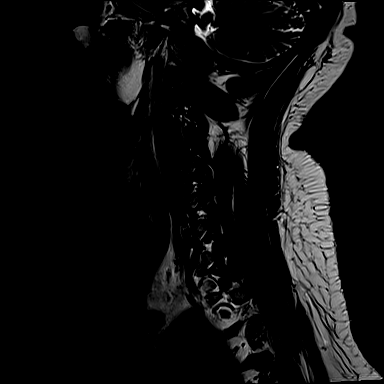
[im 7/19]
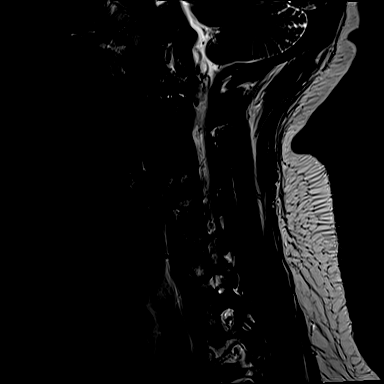
[im 10/19]
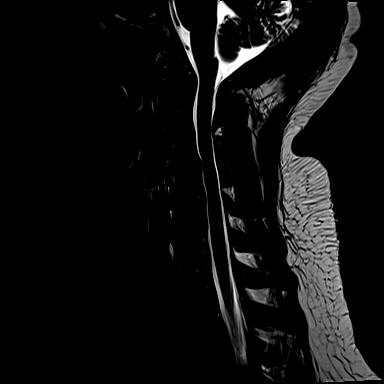
[im 13/19]
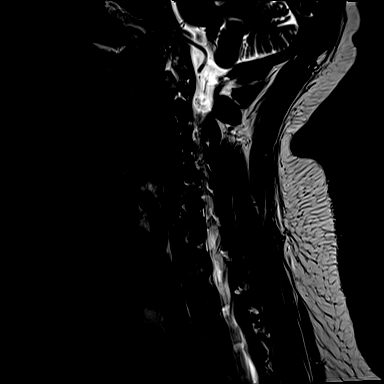
[im 16/19]
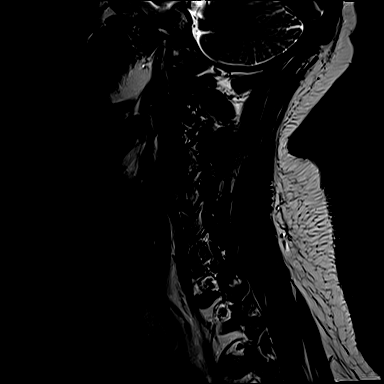
[im 19/19]
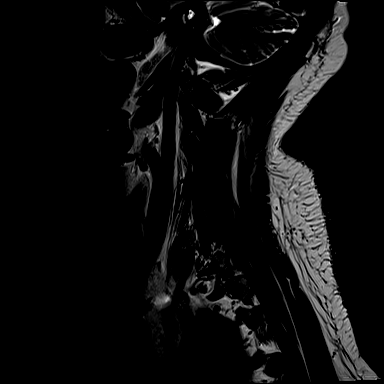

[Series 8: STIR · sagittal · 3.0mm · 0.33mm/px · 5 of 19 slices shown]
[im 1/19]
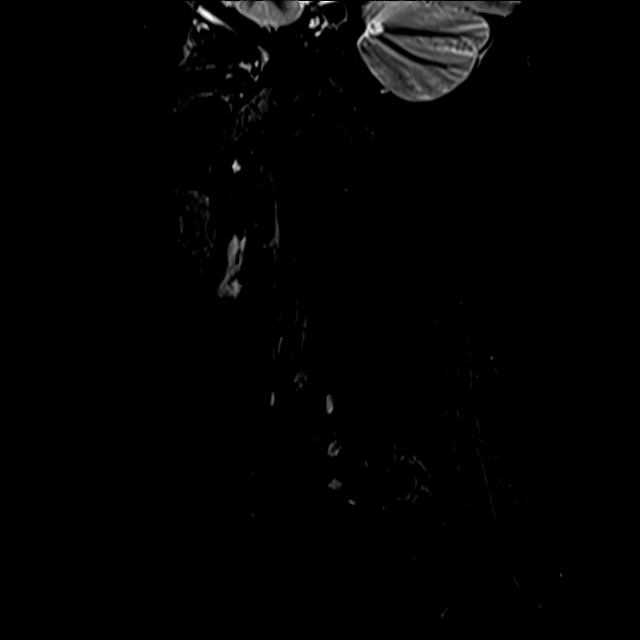
[im 4/19]
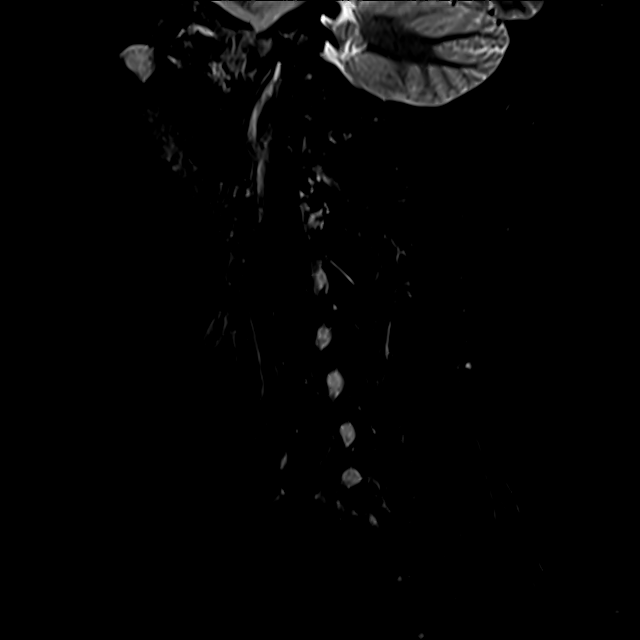
[im 7/19]
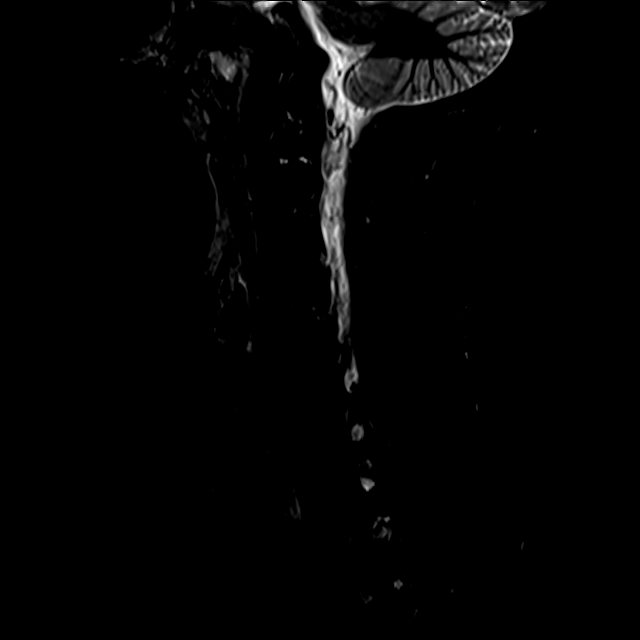
[im 10/19]
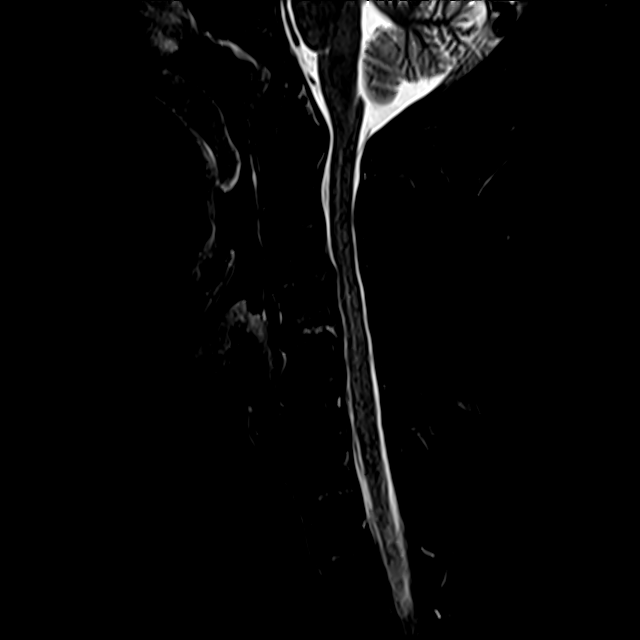
[im 16/19]
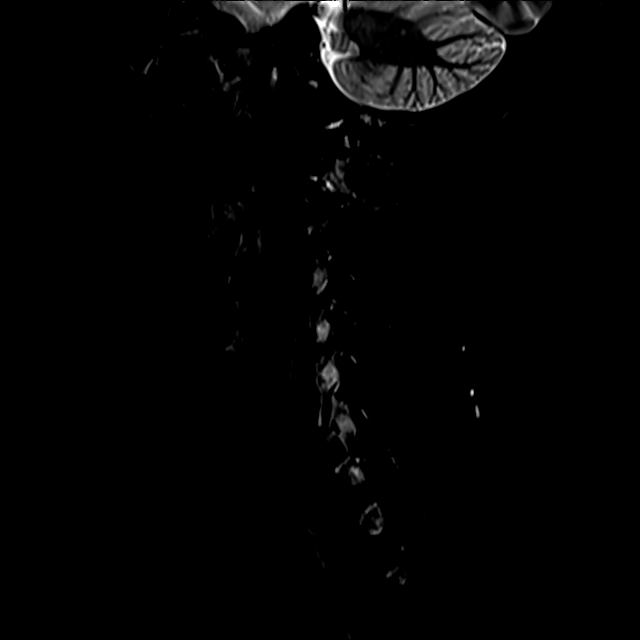

[Series 9: T2 · axial · 3.0mm · 0.50mm/px · z∈[-77,+27]mm · 9 of 34 slices shown (2 of 2)]
[im 1/34]
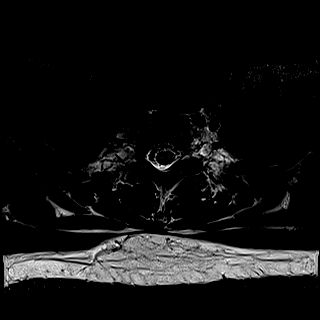
[im 6/34]
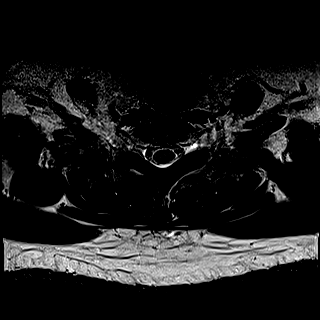
[im 12/34]
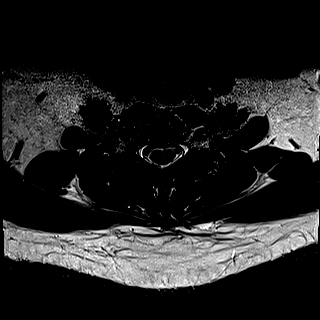
[im 14/34]
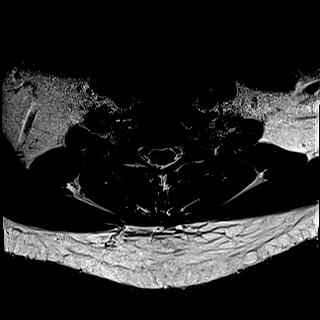
[im 17/34]
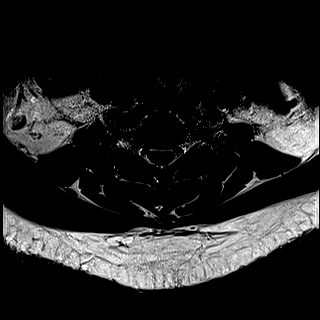
[im 20/34]
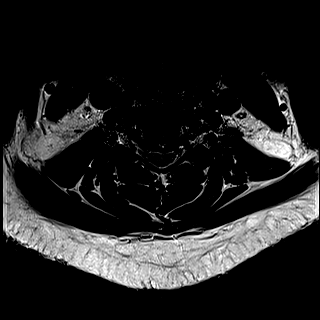
[im 23/34]
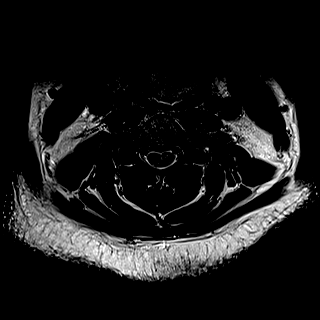
[im 28/34]
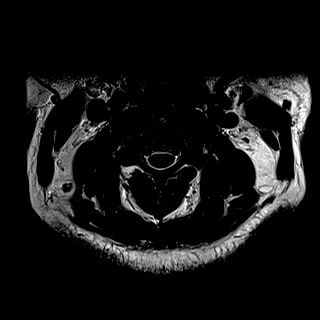
[im 34/34]
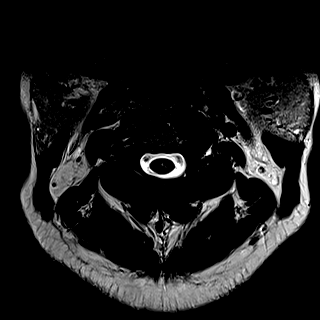

[29 of 48 positions shown; findings below may reference images not displayed]

FINDINGS: Alignment: Straightening of the normal cervical lordosis.

Vertebrae: No fracture or primary bone lesion.

Cord: No cord compression or primary cord lesion.

Posterior Fossa, vertebral arteries, paraspinal tissues: Negative

Disc levels:

Foramen magnum is widely patent. Mild osteoarthritis at the C1-2
articulation but without encroachment upon the neural structures.

C2-3: Normal interspace.

C3-4: Central bulging of the disc. Narrowing of the ventral
subarachnoid space but no compression of the cord. No foraminal
stenosis.

C4-5: Shallow disc protrusion centrally and to the left with slight
caudal down turning. Narrowing of the ventral subarachnoid space but
no compression of the cord. No foraminal extension.

C5-6: Bulging of the disc slightly more towards the left. No canal
or foraminal stenosis.

C6-7: Normal interspace.

C7-T1: Normal interspace.

Compared to the previous study, the findings at C4-5 have worsened
slightly.
IMPRESSION: At C4-5, there is a shallow left posterolateral disc herniation with
caudal migration down behind C5. This indents the ventral
subarachnoid space but does not compress the cord or show foraminal
extension. This has worsened since 9770.

Non-compressive disc bulges at C3-4 and C5-6.
